# Patient Record
Sex: Male | Born: 1960
Health system: Southern US, Community
[De-identification: ages and names within clinical notes are randomized; demographics above are authoritative.]

## PROBLEM LIST (undated history)

## (undated) DIAGNOSIS — T7840XA Allergy, unspecified, initial encounter: Secondary | ICD-10-CM

## (undated) DIAGNOSIS — I10 Essential (primary) hypertension: Secondary | ICD-10-CM

## (undated) HISTORY — DX: Essential (primary) hypertension: I10

## (undated) HISTORY — DX: Allergy, unspecified, initial encounter: T78.40XA

## (undated) HISTORY — PX: VASECTOMY: SHX75

---
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
## 2001-04-22 ENCOUNTER — Encounter: Payer: Self-pay | Admitting: Emergency Medicine

## 2001-04-22 ENCOUNTER — Emergency Department (HOSPITAL_COMMUNITY): Admission: EM | Admit: 2001-04-22 | Discharge: 2001-04-22 | Payer: Self-pay | Admitting: Emergency Medicine

## 2006-01-21 ENCOUNTER — Ambulatory Visit: Payer: Self-pay | Admitting: Family Medicine

## 2006-03-10 ENCOUNTER — Ambulatory Visit: Payer: Self-pay | Admitting: Family Medicine

## 2006-07-23 ENCOUNTER — Ambulatory Visit: Payer: Self-pay | Admitting: Family Medicine

## 2007-08-25 ENCOUNTER — Encounter: Admission: RE | Admit: 2007-08-25 | Discharge: 2007-08-25 | Payer: Self-pay | Admitting: Occupational Medicine

## 2011-02-09 ENCOUNTER — Other Ambulatory Visit: Payer: Self-pay | Admitting: Specialist

## 2011-02-09 ENCOUNTER — Ambulatory Visit (HOSPITAL_COMMUNITY)
Admission: RE | Admit: 2011-02-09 | Discharge: 2011-02-09 | Disposition: A | Discharge status: Home / Self Care | Payer: BC Managed Care – PPO | Source: Ambulatory Visit | Attending: Specialist | Admitting: Specialist

## 2011-02-09 ENCOUNTER — Other Ambulatory Visit (HOSPITAL_COMMUNITY): Payer: Self-pay | Admitting: Specialist

## 2011-02-09 ENCOUNTER — Encounter (HOSPITAL_COMMUNITY): Payer: BC Managed Care – PPO

## 2011-02-09 DIAGNOSIS — Z01818 Encounter for other preprocedural examination: Secondary | ICD-10-CM

## 2011-02-09 DIAGNOSIS — R9431 Abnormal electrocardiogram [ECG] [EKG]: Secondary | ICD-10-CM | POA: Insufficient documentation

## 2011-02-09 DIAGNOSIS — S43429A Sprain of unspecified rotator cuff capsule, initial encounter: Secondary | ICD-10-CM | POA: Insufficient documentation

## 2011-02-09 DIAGNOSIS — X58XXXA Exposure to other specified factors, initial encounter: Secondary | ICD-10-CM | POA: Insufficient documentation

## 2011-02-09 DIAGNOSIS — Z01810 Encounter for preprocedural cardiovascular examination: Secondary | ICD-10-CM | POA: Insufficient documentation

## 2011-02-09 DIAGNOSIS — Z01812 Encounter for preprocedural laboratory examination: Secondary | ICD-10-CM | POA: Insufficient documentation

## 2011-02-09 LAB — CBC
HCT: 42.3 % (ref 39.0–52.0)
Hemoglobin: 14.7 g/dL (ref 13.0–17.0)
MCH: 29.6 pg (ref 26.0–34.0)
MCHC: 34.8 g/dL (ref 30.0–36.0)
MCV: 85.1 fL (ref 78.0–100.0)
Platelets: 185 10*3/uL (ref 150–400)
RBC: 4.97 MIL/uL (ref 4.22–5.81)
RDW: 12.6 % (ref 11.5–15.5)
WBC: 5.7 10*3/uL (ref 4.0–10.5)

## 2011-02-09 LAB — BASIC METABOLIC PANEL
BUN: 15 mg/dL (ref 6–23)
CO2: 26 mEq/L (ref 19–32)
Calcium: 9.1 mg/dL (ref 8.4–10.5)
Chloride: 106 mEq/L (ref 96–112)
Creatinine, Ser: 0.93 mg/dL (ref 0.4–1.5)
GFR calc Af Amer: >60 mL/min (ref >60)
GFR calc non Af Amer: >60 mL/min (ref >60)
Glucose, Bld: 97 mg/dL (ref 70–99)
Potassium: 3.9 mEq/L (ref 3.5–5.1)
Sodium: 137 mEq/L (ref 135–145)

## 2011-02-09 LAB — SURGICAL PCR SCREEN
MRSA, PCR: NEGATIVE
Staphylococcus aureus: NEGATIVE

## 2011-02-15 HISTORY — PX: SHOULDER ARTHROSCOPY W/ ROTATOR CUFF REPAIR: SHX2400

## 2011-02-19 ENCOUNTER — Ambulatory Visit (HOSPITAL_COMMUNITY)
Admission: RE | Admit: 2011-02-19 | Discharge: 2011-02-20 | Disposition: A | Discharge status: Home / Self Care | Payer: BC Managed Care – PPO | Source: Ambulatory Visit | Attending: Specialist | Admitting: Specialist

## 2011-02-19 DIAGNOSIS — Z01811 Encounter for preprocedural respiratory examination: Secondary | ICD-10-CM | POA: Insufficient documentation

## 2011-02-19 DIAGNOSIS — E669 Obesity, unspecified: Secondary | ICD-10-CM | POA: Insufficient documentation

## 2011-02-19 DIAGNOSIS — M25819 Other specified joint disorders, unspecified shoulder: Secondary | ICD-10-CM | POA: Insufficient documentation

## 2011-02-19 DIAGNOSIS — M719 Bursopathy, unspecified: Secondary | ICD-10-CM | POA: Insufficient documentation

## 2011-02-19 DIAGNOSIS — M19019 Primary osteoarthritis, unspecified shoulder: Secondary | ICD-10-CM | POA: Insufficient documentation

## 2011-02-19 DIAGNOSIS — M67919 Unspecified disorder of synovium and tendon, unspecified shoulder: Secondary | ICD-10-CM | POA: Insufficient documentation

## 2011-02-26 NOTE — Op Note (Signed)
Billy Haynes, Billy Haynes               ACCOUNT NO.:  [id number]  MEDICAL RECORD NO.:  [id number]           PATIENT TYPE:  O  LOCATION:  1613                         FACILITY:  [facility]  PHYSICIAN:  Jene Every, M.D.    DATE OF BIRTH:  1961-10-12  DATE OF PROCEDURE: DATE OF DISCHARGE:                              OPERATIVE REPORT   PREOPERATIVE DIAGNOSES:  Massive tear of the rotator cuff, acromioclavicular arthrosis, impingement syndrome.  POSTOPERATIVE DIAGNOSES:  Massive tear of the rotator cuff, acromioclavicular arthrosis, impingement syndrome.  PROCEDURE PERFORMED: 1. Examination under anesthesia. 2. Mini open repair of a massive tear of the rotator cuff involving     the infraspinatus, supraspinatus and the subscapularis. 3. Debridement of the biceps tendon. 4. Lavage of the glenohumeral joint, subacromial decompression,     acromioplasty.  BRIEF HISTORY:  A 50 year old with retracted tear of the rotator cuff, AC arthrosis, but preoperatively and on the last examination he had no significant pain over the [facility] joint.  He had a retracted tear, however, involving the supraspinatus and the infraspinatus, degeneration of the glenohumeral joint, bicipital tearing.  He was indicated for an open rotator cuff repair, but we discussed the risks and benefits including bleeding, infection, no change in symptoms, worsening symptoms, inability to repair, need for patch graft, partial repair, need for shoulder arthroplasty in the future and possible distal clavicle resection if mobilization was needed, DVT, PE, anesthetic complications, et [name].  TECHNIQUE:  The patient in the supine beach-chair position.  After the induction of adequate general anesthesia, 2 grams Kefzol, the right shoulder and upper extremity was prepped and draped in the usual sterile fashion after examination revealed normal range of motion.  A surgical marker was utilized to delineate the acromion and the [facility] joint  and I made a small incision over the anterolateral aspect of the acromion. Subcutaneous tissue was dissected.  Electrocautery was utilized to achieve hemostasis about 2 cm in length.  The raphe between the anterolateral heads was identified, divided, subperiosteally elevated from the anterolateral and the anteromedial aspect of the acromion with a large anterolateral spur noted on the acromion which was then removed with a 3-mm Kerrison preserving the attachment of the deltoid.  We removed the anterolateral spur as well with a 3-mm Kerrison.  We digitally lysed adhesions.  Retracted tear of the rotator cuff massive was noted also involving the supraspinatus and also involving the subscapularis coming over the bicipital groove.  We lavaged the glenohumeral joint and the biceps.  There was just some generalized fraying.  This was debrided to perhaps 85% of the tendon was intact.  In its attachment we had severe adhesions of the rotator cuff against the subacromial space and beneath the glenoid.  I meticulously mobilized the cuff utilizing the Cobb, not advancing past the glenoid.  We mobilized tissue on top of the tendon, beneath the tendon and against the glenoid with reasonable mobilization of the cuff.  However, the deficiency was noted in the infraspinatus and a good portion of this was absent as I advanced the tendon and prepared a bed, removing approximately 3 mm of the  lateral portion of the articular surface anteriorly and laterally. The subscapularis tendon and the supraspinatus was advanced past the bicipital groove over to the normal footprint of the subscapularis and of the biceps of the supraspinatus.  The infraspinatus portion of it came anteriorly and laterally, however, there was a big deficiency in it posteriorly.  There was no tendon back there, however, we felt that the majority of the supraspinatus and partial of the infraspinatus and subscapularis we were able to cover  the head at least, which was uncovered when we initially opened the joint.  We prepared a bed carefully just medial to the prominence of the greater tuberosity to that portion of the articular surface.  Previously described good bone was noted.  I placed two Mitek suture anchors, one just lateral to the bicipital groove at the articular margin and one posterior to that, approximately a centimeter and a half to capture the supraspinatus, infraspinatus juncture.  I used Ethibond stitches in the rotator cuff to hold the advancement and slight abduction of the arm with the arm in the neutral position.  We then threaded both of the Mitek sutures up through the supraspinatus, the infraspinatus and the subscapularis and advanced the knot to secure that to the anchor.  We used the arthroscopic knot pusher to facilitate the tying of that within this space.  After that we crossed the sutures, laid them down over the leading edge of the tear and over the edge of the greater tuberosity put two push lock suture anchors using the awl and good bone, crossing them and advancing them into the bone with excellent purchase and pullout purchase.  This created full coverage of the bony bed with the rotator cuff leaflet at slight abduction and neutral rotation.  The crossing also matted down that lateral part of the tendon, giving [place] not only a double row of suture fixation but a cross fixation on top of the tendon as well.  I then checked posteriorly as the infraspinatus coursed backwards over the back portion of the humeral head and reexamined meticulously any area where there was any additional infraspinatus that could be advanced and secured posteriorly.  There was not, however, and so there was a deficiency posteriorly for that.  The wound was copiously irrigated. There was no significant tension on the site.  Copiously irrigated the wound with antibiotic irrigation.  Marcaine with epinephrine  was infiltrated in the deltoid and the soft tissues.  Electrocautery was utilized to achieve hemostasis.  I repaired the raphe with #1 Vicryl interrupted figure-of-eight sutures over the top and through the acromion, subcutaneous with 2-0 Vicryl simple sutures.  The skin was reapproximated with 4-0 subcuticular Prolene.  The wound was reinforced was Steri-Strips, placed in an abduction pillow, extubated without difficulty and transported to the recovery room in satisfactory condition.  The patient tolerated the procedure well and there were no complications.  ASSISTANT:  Roma Schanz, P.A.     Jene Every, M.D.     Cordelia Pen  D:  02/19/2011  T:  02/19/2011  Job:  [id number]  Electronically Signed by Jene Every M.D. on 02/26/2011 12:54:18 PM

## 2016-03-11 DIAGNOSIS — L821 Other seborrheic keratosis: Secondary | ICD-10-CM | POA: Diagnosis not present

## 2016-03-11 DIAGNOSIS — D225 Melanocytic nevi of trunk: Secondary | ICD-10-CM | POA: Diagnosis not present

## 2016-03-11 DIAGNOSIS — L570 Actinic keratosis: Secondary | ICD-10-CM | POA: Diagnosis not present

## 2016-03-11 DIAGNOSIS — Z86018 Personal history of other benign neoplasm: Secondary | ICD-10-CM | POA: Diagnosis not present

## 2016-06-08 DIAGNOSIS — L570 Actinic keratosis: Secondary | ICD-10-CM | POA: Diagnosis not present

## 2016-06-08 DIAGNOSIS — L820 Inflamed seborrheic keratosis: Secondary | ICD-10-CM | POA: Diagnosis not present

## 2016-06-08 DIAGNOSIS — D485 Neoplasm of uncertain behavior of skin: Secondary | ICD-10-CM | POA: Diagnosis not present

## 2017-01-15 ENCOUNTER — Ambulatory Visit (INDEPENDENT_AMBULATORY_CARE_PROVIDER_SITE_OTHER): Payer: BLUE CROSS/BLUE SHIELD | Admitting: Physician Assistant

## 2017-01-15 VITALS — BP 122/76 | HR 71 | Temp 98.1°F | Ht 71.0 in | Wt 232.2 lb

## 2017-01-15 DIAGNOSIS — B349 Viral infection, unspecified: Secondary | ICD-10-CM | POA: Diagnosis not present

## 2017-01-15 DIAGNOSIS — R05 Cough: Secondary | ICD-10-CM | POA: Diagnosis not present

## 2017-01-15 DIAGNOSIS — J3489 Other specified disorders of nose and nasal sinuses: Secondary | ICD-10-CM

## 2017-01-15 DIAGNOSIS — R059 Cough, unspecified: Secondary | ICD-10-CM

## 2017-01-15 MED ORDER — FLUTICASONE PROPIONATE 50 MCG/ACT NA SUSP: 2.0000 | Freq: Every day | NASAL | 6 refills | Status: DC

## 2017-01-15 MED ORDER — BENZONATATE 100 MG PO CAPS: 100.0000 mg | ORAL_CAPSULE | Freq: Three times a day (TID) | ORAL | 0 refills | Status: DC | PRN

## 2017-01-15 MED ORDER — MUCINEX DM MAXIMUM STRENGTH 60-1200 MG PO TB12: 1.0000 | ORAL_TABLET | Freq: Two times a day (BID) | ORAL | 1 refills | Status: DC

## 2017-01-15 MED ORDER — HYDROCODONE-HOMATROPINE 5-1.5 MG/5ML PO SYRP: 5.0000 mL | ORAL_SOLUTION | Freq: Three times a day (TID) | ORAL | 0 refills | Status: DC | PRN

## 2017-01-15 NOTE — Addendum Note (Signed)
Addended by: Dorise Hiss on: 01/15/2017 09:56 AM   Modules accepted: Level of Service

## 2017-01-15 NOTE — Progress Notes (Signed)
   Billy Haynes  MRN: [id number] DOB: 05/22/1961  PCP: No primary care provider on file.  Subjective:  Pt is a 56 year old male who presents to clinic for cough x 2 days. Endorses chest congestion, nasal congestion. He is sleeping ok. Appetite okay.  Denies muscle aches, fatigue, fever, chills, SOB, wheezing.  He has taken Alkaseltzer cold plus, mucinex, ibuprofen.  Did not get flu shot this year.   Review of Systems  Constitutional: Negative for chills, diaphoresis and fever.  HENT: Positive for congestion, postnasal drip and rhinorrhea. Negative for sore throat.   Respiratory: Positive for cough. Negative for chest tightness, shortness of breath and wheezing.   Cardiovascular: Negative for chest pain and palpitations.  Gastrointestinal: Negative for abdominal pain, diarrhea, nausea and vomiting.  Psychiatric/Behavioral: Negative for sleep disturbance.    There are no active problems to display for this patient.   No current outpatient prescriptions on file prior to visit.   No current facility-administered medications on file prior to visit.     No Known Allergies   Objective:  BP 122/76 (BP Location: Right Arm, Patient Position: Sitting, Cuff Size: Large)   Pulse 71   Temp 98.1 F (36.7 C) (Oral)   Ht 5\' 11"  (1.803 m)   Wt 232 lb 3.2 oz (105.3 kg)   SpO2 97%   BMI 32.39 kg/m   Physical Exam  Constitutional: He is oriented to person, place, and time and well-developed, well-nourished, and in no distress. No distress.  HENT:  Right Ear: Tympanic membrane normal.  Left Ear: Tympanic membrane normal.  Nose: Mucosal edema and rhinorrhea present. Right sinus exhibits no maxillary sinus tenderness and no frontal sinus tenderness. Left sinus exhibits no maxillary sinus tenderness and no frontal sinus tenderness.  Mouth/Throat: Mucous membranes are normal. Posterior oropharyngeal edema present. No oropharyngeal exudate or posterior oropharyngeal erythema.    Cardiovascular: Normal rate, regular rhythm and normal heart sounds.   Pulmonary/Chest: Effort normal. No respiratory distress. He has no wheezes. He has no rales.  Neurological: He is alert and oriented to person, place, and time. GCS score is 15.  Skin: Skin is warm and dry.  Psychiatric: Mood, memory, affect and judgment normal.  Vitals reviewed.   Assessment and Plan :  1. Viral illness 2. Cough 3. Nasal drainage - HYDROcodone-homatropine (HYCODAN) 5-1.5 MG/5ML syrup; Take 5 mLs by mouth every 8 (eight) hours as needed for cough.  Dispense: 120 mL; Refill: 0 - benzonatate (TESSALON) 100 MG capsule; Take 1-2 capsules (100-200 mg total) by mouth 3 (three) times daily as needed for cough.  Dispense: 40 capsule; Refill: 0 - Dextromethorphan-Guaifenesin (MUCINEX DM MAXIMUM STRENGTH) 60-1200 MG TB12; Take 1 tablet by mouth every 12 (twelve) hours.  Dispense: 14 each; Refill: 1 - fluticasone (FLONASE) 50 MCG/ACT nasal spray; Place 2 sprays into both nostrils daily.  Dispense: 16 g; Refill: 6 - Suspect viral illness. Supportive care: Push fluids, rest. RTC in 5-7 days if no improvement.   Mercer Pod, PA-C  [facility] 01/15/2017 8:39 AM

## 2017-01-15 NOTE — Patient Instructions (Addendum)
Please stay well hydrated - drink 2-3 liters of water a day. Warm tea with honey and lemon is helpful for sore throat and cough.  Hycodan is a controlled substance. Please take this as directed.  Flonse - 2 sprays each nostril before bed. You may repeat this in the morning if needed.  Tessalon is for daytime cough.  Thank you for coming in today. I hope you feel we met your needs.  Feel free to call [facility] if you have any questions or further requests.  Please consider signing up for MyChart if you do not already have it, as this is a great way to communicate with me.  Best,  Whitney McVey, PA-C  IF you received an x-ray today, you will receive an invoice from [facility]. Please contact [facility] at [phone] with questions or concerns regarding your invoice.   IF you received labwork today, you will receive an invoice from [facility]. Please contact [facility] at [phone] with questions or concerns regarding your invoice.   Our billing staff will not be able to assist you with questions regarding bills from these companies.  You will be contacted with the lab results as soon as they are available. The fastest way to get your results is to activate your My Chart account. Instructions are located on the last page of this paperwork. If you have not heard from [place] regarding the results in 2 weeks, please contact this office.

## 2017-02-15 ENCOUNTER — Ambulatory Visit (INDEPENDENT_AMBULATORY_CARE_PROVIDER_SITE_OTHER): Payer: BLUE CROSS/BLUE SHIELD

## 2017-02-15 ENCOUNTER — Ambulatory Visit (INDEPENDENT_AMBULATORY_CARE_PROVIDER_SITE_OTHER): Payer: BLUE CROSS/BLUE SHIELD | Admitting: Emergency Medicine

## 2017-02-15 VITALS — BP 130/90 | HR 85 | Temp 99.0°F | Resp 18 | Ht 71.0 in | Wt 232.0 lb

## 2017-02-15 DIAGNOSIS — R05 Cough: Secondary | ICD-10-CM

## 2017-02-15 DIAGNOSIS — J209 Acute bronchitis, unspecified: Secondary | ICD-10-CM | POA: Diagnosis not present

## 2017-02-15 DIAGNOSIS — R059 Cough, unspecified: Secondary | ICD-10-CM

## 2017-02-15 MED ORDER — HYDROCODONE-HOMATROPINE 5-1.5 MG/5ML PO SYRP: 5.0000 mL | ORAL_SOLUTION | Freq: Three times a day (TID) | ORAL | 0 refills | Status: DC | PRN

## 2017-02-15 MED ORDER — AZITHROMYCIN 250 MG PO TABS
ORAL_TABLET | ORAL | 0 refills | Status: DC
Start: 2017-02-15 — End: 2018-11-02

## 2017-02-15 NOTE — Patient Instructions (Addendum)
     IF you received an x-ray today, you will receive an invoice from [facility]. Please contact [facility] at [phone] with questions or concerns regarding your invoice.   IF you received labwork today, you will receive an invoice from [facility]. Please contact [facility] at [phone] with questions or concerns regarding your invoice.   Our billing staff will not be able to assist you with questions regarding bills from these companies.  You will be contacted with the lab results as soon as they are available. The fastest way to get your results is to activate your My Chart account. Instructions are located on the last page of this paperwork. If you have not heard from us regarding the results in 2 weeks, please contact this office.      Acute Bronchitis, Adult Acute bronchitis is when air tubes (bronchi) in the lungs suddenly get swollen. The condition can make it hard to breathe. It can also cause these symptoms:  A cough.  Coughing up clear, yellow, or green mucus.  Wheezing.  Chest congestion.  Shortness of breath.  A fever.  Body aches.  Chills.  A sore throat.  Follow these instructions at home: Medicines  Take over-the-counter and prescription medicines only as told by your doctor.  If you were prescribed an antibiotic medicine, take it as told by your doctor. Do not stop taking the antibiotic even if you start to feel better. General instructions  Rest.  Drink enough fluids to keep your pee (urine) clear or pale yellow.  Avoid smoking and secondhand smoke. If you smoke and you need help quitting, ask your doctor. Quitting will help your lungs heal faster.  Use an inhaler, cool mist vaporizer, or humidifier as told by your doctor.  Keep all follow-up visits as told by your doctor. This is important. How is this prevented? To lower your risk of getting this condition again:  Wash your hands often with soap and water. If you cannot  use soap and water, use hand sanitizer.  Avoid contact with people who have cold symptoms.  Try not to touch your hands to your mouth, nose, or eyes.  Make sure to get the flu shot every year.  Contact a doctor if:  Your symptoms do not get better in 2 weeks. Get help right away if:  You cough up blood.  You have chest pain.  You have very bad shortness of breath.  You become dehydrated.  You faint (pass out) or keep feeling like you are going to pass out.  You keep throwing up (vomiting).  You have a very bad headache.  Your fever or chills gets worse. This information is not intended to replace advice given to you by your health care provider. Make sure you discuss any questions you have with your health care provider. Document Released: 04/27/2008 Document Revised: 06/17/2016 Document Reviewed: 04/29/2016 Elsevier Interactive Patient Education  2017 Elsevier Inc.  

## 2017-02-15 NOTE — Progress Notes (Signed)
Billy Haynes 56 y.o.   Chief Complaint  Patient presents with  . Fever  . Diarrhea    HISTORY OF PRESENT ILLNESS: This is a 56 y.o. male complaining of cough, fever, flu-like symptoms x 4-5 days; yesterday had diarrhea x2.  Cough  This is a new problem. The current episode started in the past 7 days. The problem has been gradually worsening. The cough is productive of sputum. Associated symptoms include nasal congestion. Pertinent negatives include no chest pain, chills, fever, headaches, heartburn, hemoptysis, myalgias, rash, rhinorrhea, sore throat or shortness of breath. There is no history of asthma, COPD or emphysema.     Prior to Admission medications   Medication Sig Start Date End Date Taking? Authorizing Provider  Dextromethorphan-Guaifenesin (MUCINEX DM MAXIMUM STRENGTH) 60-1200 MG TB12 Take 1 tablet by mouth every 12 (twelve) hours. 01/15/17  Yes Elizabeth Whitney McVey, PA-C  benzonatate (TESSALON) 100 MG capsule Take 1-2 capsules (100-200 mg total) by mouth 3 (three) times daily as needed for cough. Patient not taking: Reported on 02/15/2017 01/15/17   Gelene Mink McVey, PA-C  fluticasone [facility]) 50 MCG/ACT nasal spray Place 2 sprays into both nostrils daily. Patient not taking: Reported on 02/15/2017 01/15/17   [facility] McVey, PA-C  HYDROcodone-homatropine [facility]) 5-1.5 MG/5ML syrup Take 5 mLs by mouth every 8 (eight) hours as needed for cough. Patient not taking: Reported on 02/15/2017 01/15/17   Gelene Mink McVey, PA-C    No Known Allergies  There are no active problems to display for this patient.   History reviewed. No pertinent past medical history.  Past Surgical History:  Procedure Laterality Date  . SHOULDER ARTHROSCOPY W/ ROTATOR CUFF REPAIR Right 02/15/2011  . VASECTOMY      Social History   Social History  . Marital status: Married    Spouse name: N/A  . Number of children: N/A  . Years of education: N/A   Occupational  History  . Not on file.   Social History Main Topics  . Smoking status: Never Smoker  . Smokeless tobacco: Never Used  . Alcohol use 1.2 oz/week    2 Cans of beer per week  . Drug use: No  . Sexual activity: Not on file   Other Topics Concern  . Not on file   Social History Narrative  . No narrative on file    Family History  Problem Relation Age of Onset  . Hyperlipidemia Mother   . Heart disease Father   . Hyperlipidemia Father      Review of Systems  Constitutional: Negative for chills and fever.  HENT: Negative for nosebleeds, rhinorrhea, sinus pain and sore throat.   Eyes: Negative for blurred vision and double vision.  Respiratory: Positive for cough. Negative for hemoptysis and shortness of breath.   Cardiovascular: Negative for chest pain and palpitations.  Gastrointestinal: Negative for abdominal pain, diarrhea, heartburn, nausea and vomiting.  Genitourinary: Negative for dysuria and hematuria.  Musculoskeletal: Negative for myalgias.  Skin: Negative for rash.  Neurological: Negative for dizziness and headaches.  All other systems reviewed and are negative.  Vitals:   02/15/17 1551 02/15/17 1604  BP: (!) 146/91 130/90  Pulse: 85   Resp: 18   Temp: 99 F (37.2 C)     Physical Exam  Constitutional: He is oriented to person, place, and time. He appears well-developed and well-nourished.  HENT:  Head: Normocephalic and atraumatic.  Nose: Nose normal.  Mouth/Throat: Oropharynx is clear and moist. No oropharyngeal exudate.  Eyes:  Conjunctivae and EOM are normal. Pupils are equal, round, and reactive to light.  Neck: Normal range of motion. Neck supple. No JVD present. No thyromegaly present.  Cardiovascular: Normal rate, regular rhythm and normal heart sounds.   Pulmonary/Chest: Effort normal and breath sounds normal.  Abdominal: Soft. Bowel sounds are normal. He exhibits no distension. There is no tenderness.  Musculoskeletal: Normal range of motion.    Lymphadenopathy:    He has no cervical adenopathy.  Neurological: He is alert and oriented to person, place, and time. No sensory deficit. He exhibits normal muscle tone.  Skin: Skin is warm and dry. Capillary refill takes less than 2 seconds.  Psychiatric: He has a normal mood and affect. His behavior is normal.  Vitals reviewed.  CXR: NAD  ASSESSMENT & PLAN: Kaamil was seen today for fever and diarrhea.  Diagnoses and all orders for this visit:  Acute bronchitis, unspecified organism  Cough -     DG Chest 2 View; Future  Other orders -     azithromycin (ZITHROMAX) 250 MG tablet; Sig as indicated -     HYDROcodone-homatropine (HYCODAN) 5-1.5 MG/5ML syrup; Take 5 mLs by mouth every 8 (eight) hours as needed for cough.    Patient Instructions       IF you received an x-ray today, you will receive an invoice from [facility]. Please contact [facility] at [phone] with questions or concerns regarding your invoice.   IF you received labwork today, you will receive an invoice from [facility]. Please contact [facility] at [phone] with questions or concerns regarding your invoice.   Our billing staff will not be able to assist you with questions regarding bills from these companies.  You will be contacted with the lab results as soon as they are available. The fastest way to get your results is to activate your My Chart account. Instructions are located on the last page of this paperwork. If you have not heard from [place] regarding the results in 2 weeks, please contact this office.      Acute Bronchitis, Adult Acute bronchitis is when air tubes (bronchi) in the lungs suddenly get swollen. The condition can make it hard to breathe. It can also cause these symptoms:  A cough.  Coughing up clear, yellow, or green mucus.  Wheezing.  Chest congestion.  Shortness of breath.  A fever.  Body aches.  Chills.  A sore throat. Follow these  instructions at home: Medicines   Take over-the-counter and prescription medicines only as told by your doctor.  If you were prescribed an antibiotic medicine, take it as told by your doctor. Do not stop taking the antibiotic even if you start to feel better. General instructions   Rest.  Drink enough fluids to keep your pee (urine) clear or pale yellow.  Avoid smoking and secondhand smoke. If you smoke and you need help quitting, ask your doctor. Quitting will help your lungs heal faster.  Use an inhaler, cool mist vaporizer, or humidifier as told by your doctor.  Keep all follow-up visits as told by your doctor. This is important. How is this prevented? To lower your risk of getting this condition again:  Wash your hands often with soap and water. If you cannot use soap and water, use hand sanitizer.  Avoid contact with people who have cold symptoms.  Try not to touch your hands to your mouth, nose, or eyes.  Make sure to get the flu shot every year. Contact a doctor if:  Your symptoms do not get better in 2 weeks. Get help right away if:  You cough up blood.  You have chest pain.  You have very bad shortness of breath.  You become dehydrated.  You faint (pass out) or keep feeling like you are going to pass out.  You keep throwing up (vomiting).  You have a very bad headache.  Your fever or chills gets worse. This information is not intended to replace advice given to you by your health care provider. Make sure you discuss any questions you have with your health care provider. Document Released: 04/27/2008 Document Revised: 06/17/2016 Document Reviewed: 04/29/2016 Elsevier Interactive Patient Education  2017 Elsevier Inc.      Agustina Caroli, MD [facility]

## 2017-04-12 DIAGNOSIS — Z86018 Personal history of other benign neoplasm: Secondary | ICD-10-CM | POA: Diagnosis not present

## 2017-04-12 DIAGNOSIS — D1801 Hemangioma of skin and subcutaneous tissue: Secondary | ICD-10-CM | POA: Diagnosis not present

## 2017-04-12 DIAGNOSIS — L814 Other melanin hyperpigmentation: Secondary | ICD-10-CM | POA: Diagnosis not present

## 2017-04-12 DIAGNOSIS — L821 Other seborrheic keratosis: Secondary | ICD-10-CM | POA: Diagnosis not present

## 2017-04-12 DIAGNOSIS — L820 Inflamed seborrheic keratosis: Secondary | ICD-10-CM | POA: Diagnosis not present

## 2017-09-02 DIAGNOSIS — L820 Inflamed seborrheic keratosis: Secondary | ICD-10-CM | POA: Diagnosis not present

## 2017-09-02 DIAGNOSIS — L821 Other seborrheic keratosis: Secondary | ICD-10-CM | POA: Diagnosis not present

## 2017-09-02 DIAGNOSIS — L812 Freckles: Secondary | ICD-10-CM | POA: Diagnosis not present

## 2017-09-02 DIAGNOSIS — D225 Melanocytic nevi of trunk: Secondary | ICD-10-CM | POA: Diagnosis not present

## 2018-04-04 DIAGNOSIS — D2272 Melanocytic nevi of left lower limb, including hip: Secondary | ICD-10-CM | POA: Diagnosis not present

## 2018-04-04 DIAGNOSIS — D225 Melanocytic nevi of trunk: Secondary | ICD-10-CM | POA: Diagnosis not present

## 2018-04-04 DIAGNOSIS — L821 Other seborrheic keratosis: Secondary | ICD-10-CM | POA: Diagnosis not present

## 2018-04-04 DIAGNOSIS — I788 Other diseases of capillaries: Secondary | ICD-10-CM | POA: Diagnosis not present

## 2018-11-02 ENCOUNTER — Ambulatory Visit (INDEPENDENT_AMBULATORY_CARE_PROVIDER_SITE_OTHER): Payer: BLUE CROSS/BLUE SHIELD | Admitting: Family Medicine

## 2018-11-02 VITALS — BP 146/102 | HR 68 | Temp 97.8°F | Ht 70.25 in | Wt 233.8 lb

## 2018-11-02 DIAGNOSIS — Z23 Encounter for immunization: Secondary | ICD-10-CM | POA: Diagnosis not present

## 2018-11-02 DIAGNOSIS — Z0000 Encounter for general adult medical examination without abnormal findings: Secondary | ICD-10-CM

## 2018-11-02 DIAGNOSIS — R030 Elevated blood-pressure reading, without diagnosis of hypertension: Secondary | ICD-10-CM

## 2018-11-02 DIAGNOSIS — Z1211 Encounter for screening for malignant neoplasm of colon: Secondary | ICD-10-CM | POA: Diagnosis not present

## 2018-11-02 DIAGNOSIS — Z125 Encounter for screening for malignant neoplasm of prostate: Secondary | ICD-10-CM | POA: Diagnosis not present

## 2018-11-02 DIAGNOSIS — E669 Obesity, unspecified: Secondary | ICD-10-CM | POA: Diagnosis not present

## 2018-11-02 NOTE — Progress Notes (Signed)
Subjective:    Patient ID: Billy Haynes, male    DOB: 05-02-1961, 57 y.o.   MRN: [id number]  HPI This is a 57 yo male who presents today to establish care and for complete physical exam. Has not had regular care other than urgent care.  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  His job is sedentary.  He is married and has 2 children.  Last CPE- many years PSA-unknown Colonoscopy-never, will put in referral today Tdap-greater than 10 years, will have today Flu-declines Dental-regular Eye-overdue Exercise-intermittently goes to the gym   No past medical history on file. Past Surgical History:  Procedure Laterality Date  . SHOULDER ARTHROSCOPY W/ ROTATOR CUFF REPAIR Right 02/15/2011  . VASECTOMY     Family History  Problem Relation Age of Onset  . Hyperlipidemia Mother   . Heart disease Father   . Hyperlipidemia Father    Social History   Tobacco Use  . Smoking status: Never Smoker  . Smokeless tobacco: Never Used  Substance Use Topics  . Alcohol use: Yes    Alcohol/week: 2.0 standard drinks    Types: 2 Cans of beer per week  . Drug use: No      Review of Systems  Constitutional: Negative.   HENT: Negative.   Eyes: Negative.   Respiratory: Negative.   Cardiovascular: Negative.   Gastrointestinal: Negative.   Endocrine: Negative.   Genitourinary: Negative.   Musculoskeletal: Negative.   Skin: Negative.   Allergic/Immunologic: Negative.   Neurological: Negative.   Hematological: Negative.   Psychiatric/Behavioral: Negative.        Objective:   Physical Exam Physical Exam  Constitutional: He is oriented to person, place, and time. He appears well-developed and well-nourished.  HENT:  Head: Normocephalic and atraumatic.  Right Ear: External ear normal.  Left Ear: External ear normal.  Nose: Nose normal.  Mouth/Throat: Oropharynx is clear and moist.  Eyes: Conjunctivae are normal. Pupils are equal, round, and reactive to light.  Neck:  Normal range of motion. Neck supple.  Cardiovascular: Normal rate, regular rhythm, normal heart sounds and intact distal pulses.   Pulmonary/Chest: Effort normal and breath sounds normal.  Abdominal: Soft. Bowel sounds are normal. Hernia confirmed negative in the right inguinal area and confirmed negative in the left inguinal area.  Genitourinary: Testes normal and penis normal. Circumcised.  Musculoskeletal: Normal range of motion. He exhibits no edema or tenderness.       Cervical back: Normal.       Thoracic back: Normal.       Lumbar back: Normal.  Lymphadenopathy:    He has no cervical adenopathy.       Right: No inguinal adenopathy present.       Left: No inguinal adenopathy present.  Neurological: He is alert and oriented to person, place, and time. He has normal reflexes.  Skin: Skin is warm and dry.  Psychiatric: He has a normal mood and affect. His behavior is normal. Judgment normal.  Vitals reviewed.   BP (!) 140/98 (BP Location: Right Arm, Patient Position: Sitting, Cuff Size: Normal)   Pulse 68   Temp 97.8 F (36.6 C) (Oral)   Ht 5' 10.25" (1.784 m)   Wt 233 lb 12.8 oz (106.1 kg)   SpO2 97%   BMI 33.31 kg/m  Wt Readings from Last 3 Encounters:  11/02/18 233 lb 12.8 oz (106.1 kg)  02/15/17 232 lb (105.2 kg)  01/15/17 232 lb 3.2 oz (105.3 kg)   BP  Readings from Last 3 Encounters:  11/02/18 (!) 146/102  02/15/17 130/90  01/15/17 122/76   Depression screen PHQ 2/9 11/02/2018 02/15/2017 01/15/2017  Decreased Interest 0 0 0  Down, Depressed, Hopeless 0 0 0  PHQ - 2 Score 0 0 0       Assessment & Plan:  1. Annual physical exam - Discussed and encouraged healthy lifestyle choices- adequate sleep, regular exercise, stress management and healthy food choices.    2. Obesity (BMI 30.0-34.9) -Encouraged healthy food choices and regular exercise - CBC with Differential - Comprehensive metabolic panel - Lipid Panel - Hemoglobin A1c  3. Screening for prostate  cancer - PSA  4. Screening for colon cancer -He is agreeable for screening, referral placed - Ambulatory referral to Gastroenterology  5. Need for Tdap vaccination - Tdap vaccine greater than or equal to 7yo IM  6. Elevated blood pressure reading -Discussed with patient, encouraged weight loss, regular exercise, decrease sodium intake  Clarene Reamer, FNP-BC  [facility]  11/05/2018 10:11 AM

## 2018-11-02 NOTE — Patient Instructions (Signed)
Good to see you today  I will notify you of lab results  You will get a call about your colonoscopy  Follow up in 1 year  Keeping you healthy  Get these tests  Blood pressure- Have your blood pressure checked once a year by your healthcare provider.  Normal blood pressure is 120/80  Weight- Have your body mass index (BMI) calculated to screen for obesity.  BMI is a measure of body fat based on height and weight. You can also calculate your own BMI at ViewBanking.si.  Cholesterol- Have your cholesterol checked every year.  Diabetes- Have your blood sugar checked regularly if you have high blood pressure, high cholesterol, have a family history of diabetes or if you are overweight.  Screening for Colon Cancer- Colonoscopy starting at age 56.  Screening may begin sooner depending on your family history and other health conditions. Follow up colonoscopy as directed by your Gastroenterologist.  Screening for Prostate Cancer- Both blood work (PSA) and a rectal exam help screen for Prostate Cancer.  Screening begins at age 76 with African-American men and at age 68 with Caucasian men.  Screening may begin sooner depending on your family history.  Take these medicines  Aspirin- One aspirin daily can help prevent Heart disease and Stroke.  Flu shot- Every fall.  Tetanus- Every 10 years.  Zostavax- Once after the age of 60 to prevent Shingles.  Pneumonia shot- Once after the age of 78; if you are younger than 27, ask your healthcare provider if you need a Pneumonia shot.  Take these steps  Don't smoke- If you do smoke, talk to your doctor about quitting.  For tips on how to quit, go to www.smokefree.gov or call 1-800-QUIT-NOW.  Be physically active- Exercise 5 days a week for at least 30 minutes.  If you are not already physically active start slow and gradually work up to 30 minutes of moderate physical activity.  Examples of moderate activity include walking briskly, mowing  the yard, dancing, swimming, bicycling, etc.  Eat a healthy diet- Eat a variety of healthy food such as fruits, vegetables, low fat milk, low fat cheese, yogurt, lean meant, poultry, fish, beans, tofu, etc. For more information go to www.thenutritionsource.org  Drink alcohol in moderation- Limit alcohol intake to less than two drinks a day. Never drink and drive.  Dentist- Brush and floss twice daily; visit your dentist twice a year.  Depression- Your emotional health is as important as your physical health. If you're feeling down, or losing interest in things you would normally enjoy please talk to your healthcare provider.  Eye exam- Visit your eye doctor every year.  Safe sex- If you may be exposed to a sexually transmitted infection, use a condom.  Seat belts- Seat belts can save your life; always wear one.  Smoke/Carbon Monoxide detectors- These detectors need to be installed on the appropriate level of your home.  Replace batteries at least once a year.  Skin cancer- When out in the sun, cover up and use sunscreen 15 SPF or higher.  Violence- If anyone is threatening you, please tell your healthcare provider.  Living Will/ Health care power of attorney- Speak with your healthcare provider and family.

## 2018-11-03 LAB — CBC WITH DIFFERENTIAL/PLATELET
Basophils Absolute: 0 10*3/uL (ref 0.0–0.1)
Basophils Relative: 0.7 % (ref 0.0–3.0)
Eosinophils Absolute: 0.4 10*3/uL (ref 0.0–0.7)
Eosinophils Relative: 6.4 % — ABNORMAL HIGH (ref 0.0–5.0)
HCT: 46.4 % (ref 39.0–52.0)
Hemoglobin: 15.9 g/dL (ref 13.0–17.0)
Lymphocytes Relative: 29.5 % (ref 12.0–46.0)
Lymphs Abs: 1.9 10*3/uL (ref 0.7–4.0)
MCHC: 34.2 g/dL (ref 30.0–36.0)
MCV: 85.2 fl (ref 78.0–100.0)
Monocytes Absolute: 0.6 10*3/uL (ref 0.1–1.0)
Monocytes Relative: 9 % (ref 3.0–12.0)
Neutro Abs: 3.5 10*3/uL (ref 1.4–7.7)
Neutrophils Relative %: 54.4 % (ref 43.0–77.0)
Platelets: 238 10*3/uL (ref 150.0–400.0)
RBC: 5.45 Mil/uL (ref 4.22–5.81)
RDW: 14.2 % (ref 11.5–15.5)
WBC: 6.4 10*3/uL (ref 4.0–10.5)

## 2018-11-03 LAB — COMPREHENSIVE METABOLIC PANEL
ALT: 23 U/L (ref 0–53)
AST: 21 U/L (ref 0–37)
Albumin: 4.5 g/dL (ref 3.5–5.2)
Alkaline Phosphatase: 79 U/L (ref 39–117)
BUN: 16 mg/dL (ref 6–23)
CO2: 30 mEq/L (ref 19–32)
Calcium: 9.5 mg/dL (ref 8.4–10.5)
Chloride: 104 mEq/L (ref 96–112)
Creatinine, Ser: 1.2 mg/dL (ref 0.40–1.50)
GFR: 66.57 mL/min (ref >60.00)
Glucose, Bld: 100 mg/dL — ABNORMAL HIGH (ref 70–99)
Potassium: 4.2 mEq/L (ref 3.5–5.1)
Sodium: 140 mEq/L (ref 135–145)
Total Bilirubin: 0.6 mg/dL (ref 0.2–1.2)
Total Protein: 7.3 g/dL (ref 6.0–8.3)

## 2018-11-03 LAB — LIPID PANEL
Cholesterol: 298 mg/dL — ABNORMAL HIGH (ref 0–200)
HDL: 47.4 mg/dL (ref >39.00)
LDL Cholesterol: 224 mg/dL — ABNORMAL HIGH (ref 0–99)
NonHDL: 250.54
Total CHOL/HDL Ratio: 6
Triglycerides: 134 mg/dL (ref 0.0–149.0)
VLDL: 26.8 mg/dL (ref 0.0–40.0)

## 2018-11-03 LAB — PSA: PSA: 0.9 ng/mL (ref 0.10–4.00)

## 2018-11-03 LAB — HEMOGLOBIN A1C: Hgb A1c MFr Bld: 6.1 % (ref 4.6–6.5)

## 2018-11-04 ENCOUNTER — Encounter: Payer: Self-pay | Admitting: Family Medicine

## 2018-11-05 ENCOUNTER — Encounter: Payer: Self-pay | Admitting: Family Medicine

## 2018-11-18 ENCOUNTER — Encounter: Payer: Self-pay | Admitting: Gastroenterology

## 2018-11-24 DIAGNOSIS — J069 Acute upper respiratory infection, unspecified: Secondary | ICD-10-CM | POA: Diagnosis not present

## 2018-11-25 ENCOUNTER — Ambulatory Visit: Payer: BLUE CROSS/BLUE SHIELD | Admitting: Family Medicine

## 2018-12-29 ENCOUNTER — Encounter: Payer: Self-pay | Admitting: Gastroenterology

## 2018-12-29 ENCOUNTER — Ambulatory Visit (AMBULATORY_SURGERY_CENTER): Payer: Self-pay

## 2018-12-29 VITALS — Ht 70.5 in | Wt 237.0 lb

## 2018-12-29 DIAGNOSIS — Z1211 Encounter for screening for malignant neoplasm of colon: Secondary | ICD-10-CM

## 2018-12-29 MED ORDER — NA SULFATE-K SULFATE-MG SULF 17.5-3.13-1.6 GM/177ML PO SOLN: 1.0000 | Freq: Once | ORAL | 0 refills | Status: AC

## 2018-12-29 NOTE — Progress Notes (Signed)
No egg or soy allergy known to patient  No issues with past sedation with any surgeries  or procedures, no intubation problems  No diet pills per patient No home 02 use per patient  No blood thinners per patient  Pt denies issues with constipation  No A fib or A flutter  EMMI video sent to pt's e mail the patient declined

## 2019-01-12 ENCOUNTER — Encounter: Payer: Self-pay | Admitting: Gastroenterology

## 2019-01-12 ENCOUNTER — Ambulatory Visit (AMBULATORY_SURGERY_CENTER): Payer: BLUE CROSS/BLUE SHIELD | Admitting: Gastroenterology

## 2019-01-12 VITALS — BP 107/73 | HR 56 | Temp 96.9°F | Resp 15 | Ht 70.25 in | Wt 233.0 lb

## 2019-01-12 DIAGNOSIS — Z1211 Encounter for screening for malignant neoplasm of colon: Secondary | ICD-10-CM

## 2019-01-12 DIAGNOSIS — D122 Benign neoplasm of ascending colon: Secondary | ICD-10-CM

## 2019-01-12 MED ORDER — SODIUM CHLORIDE 0.9 % IV SOLN: 500.0000 mL | Freq: Once | INTRAVENOUS | Status: DC

## 2019-01-12 NOTE — Progress Notes (Signed)
Report to PACU, RN, vss, BBS= Clear.  

## 2019-01-12 NOTE — Patient Instructions (Signed)
Handouts given for polyps and diverticulosis  YOU HAD AN ENDOSCOPIC PROCEDURE TODAY AT [facility]:   Refer to the procedure report that was given to you for any specific questions about what was found during the examination.  If the procedure report does not answer your questions, please call your gastroenterologist to clarify.  If you requested that your care partner not be given the details of your procedure findings, then the procedure report has been included in a sealed envelope for you to review at your convenience later.  YOU SHOULD EXPECT: Some feelings of bloating in the abdomen. Passage of more gas than usual.  Walking can help get rid of the air that was put into your GI tract during the procedure and reduce the bloating. If you had a lower endoscopy (such as a colonoscopy or flexible sigmoidoscopy) you may notice spotting of blood in your stool or on the toilet paper. If you underwent a bowel prep for your procedure, you may not have a normal bowel movement for a few days.  Please Note:  You might notice some irritation and congestion in your nose or some drainage.  This is from the oxygen used during your procedure.  There is no need for concern and it should clear up in a day or so.  SYMPTOMS TO REPORT IMMEDIATELY:   Following lower endoscopy (colonoscopy or flexible sigmoidoscopy):  Excessive amounts of blood in the stool  Significant tenderness or worsening of abdominal pains  Swelling of the abdomen that is new, acute  Fever of 100F or higher  For urgent or emergent issues, a gastroenterologist can be reached at any hour by calling [phone].   DIET:  We do recommend a small meal at first, but then you may proceed to your regular diet.  Drink plenty of fluids but you should avoid alcoholic beverages for 24 hours.  ACTIVITY:  You should plan to take it easy for the rest of today and you should NOT DRIVE or use heavy machinery until tomorrow (because of  the sedation medicines used during the test).    FOLLOW UP: Our staff will call the number listed on your records the next business day following your procedure to check on you and address any questions or concerns that you may have regarding the information given to you following your procedure. If we do not reach you, we will leave a message.  However, if you are feeling well and you are not experiencing any problems, there is no need to return our call.  We will assume that you have returned to your regular daily activities without incident.  If any biopsies were taken you will be contacted by phone or by letter within the next 1-3 weeks.  Please call us at [phone] if you have not heard about the biopsies in 3 weeks.    SIGNATURES/CONFIDENTIALITY: You and/or your care partner have signed paperwork which will be entered into your electronic medical record.  These signatures attest to the fact that that the information above on your After Visit Summary has been reviewed and is understood.  Full responsibility of the confidentiality of this discharge information lies with you and/or your care-partner.

## 2019-01-12 NOTE — Op Note (Signed)
[facility] Patient Name: Billy Haynes Procedure Date: 01/12/2019 10:01 AM MRN: [id number] Endoscopist: Remo Lipps P. Havery Moros , MD Age: 58 Referring MD:  Date of Birth: [birth year] Gender: Male Account #: [id number] Procedure:                Colonoscopy Indications:              Screening for colorectal malignant neoplasm, This                            is the patient's first colonoscopy Medicines:                Monitored Anesthesia Care Procedure:                Pre-Anesthesia Assessment:                           - Prior to the procedure, a History and Physical                            was performed, and patient medications and                            allergies were reviewed. The patient's tolerance of                            previous anesthesia was also reviewed. The risks                            and benefits of the procedure and the sedation                            options and risks were discussed with the patient.                            All questions were answered, and informed consent                            was obtained. Prior Anticoagulants: The patient has                            taken no previous anticoagulant or antiplatelet                            agents. ASA Grade Assessment: II - A patient with                            mild systemic disease. After reviewing the risks                            and benefits, the patient was deemed in                            satisfactory condition to undergo the procedure.  After obtaining informed consent, the colonoscope                            was passed under direct vision. Throughout the                            procedure, the patient's blood pressure, pulse, and                            oxygen saturations were monitored continuously. The                            Colonoscope was introduced through the anus and                            advanced to the the  cecum, identified by                            appendiceal orifice and ileocecal valve. The                            colonoscopy was performed without difficulty. The                            patient tolerated the procedure well. The quality                            of the bowel preparation was good. The ileocecal                            valve, appendiceal orifice, and rectum were                            photographed. Scope In: 10:16:31 AM Scope Out: 10:36:52 AM Scope Withdrawal Time: 0 hours 18 minutes 4 seconds  Total Procedure Duration: 0 hours 20 minutes 21 seconds  Findings:                 The perianal and digital rectal examinations were                            normal.                           Two sessile polyps were found in the ascending                            colon. The polyps were 2 to 3 mm in size. These                            polyps were removed with a cold snare. Resection                            and retrieval were complete.  Multiple small-mouthed diverticula were found in                            the sigmoid colon.                           The exam was otherwise without abnormality. Complications:            No immediate complications. Estimated blood loss:                            Minimal. Estimated Blood Loss:     Estimated blood loss was minimal. Impression:               - Two 2 to 3 mm polyps in the ascending colon,                            removed with a cold snare. Resected and retrieved.                           - Diverticulosis in the sigmoid colon.                           - The examination was otherwise normal. Recommendation:           - Patient has a contact number available for                            emergencies. The signs and symptoms of potential                            delayed complications were discussed with the                            patient. Return to normal activities tomorrow.                             Written discharge instructions were provided to the                            patient.                           - Resume previous diet.                           - Continue present medications.                           - Await pathology results. Remo Lipps P. Havery Moros, MD 01/12/2019 10:39:33 AM This report has been signed electronically.

## 2019-01-12 NOTE — Progress Notes (Signed)
Called to room to assist during endoscopic procedure.  Patient ID and intended procedure confirmed with present staff. Received instructions for my participation in the procedure from the performing physician.  

## 2019-01-12 NOTE — Progress Notes (Signed)
Pt's states no medical or surgical changes since previsit or office visit. 

## 2019-01-13 ENCOUNTER — Telehealth: Payer: Self-pay

## 2019-01-13 NOTE — Telephone Encounter (Signed)
  Follow up Call-  Call back number 01/12/2019  Post procedure Call Back phone  # [phone]  Permission to leave phone message Yes  Some recent data might be hidden     Patient questions:  Do you have a fever, pain , or abdominal swelling? No. Pain Score  0 *  Have you tolerated food without any problems? Yes.    Have you been able to return to your normal activities? Yes.    Do you have any questions about your discharge instructions: Diet   No. Medications  No. Follow up visit  No.  Do you have questions or concerns about your Care? No.  Actions: * If pain score is 4 or above: No action needed, pain <4.

## 2019-01-16 ENCOUNTER — Encounter: Payer: Self-pay | Admitting: Gastroenterology

## 2019-04-04 DIAGNOSIS — D225 Melanocytic nevi of trunk: Secondary | ICD-10-CM | POA: Diagnosis not present

## 2019-04-04 DIAGNOSIS — D2271 Melanocytic nevi of right lower limb, including hip: Secondary | ICD-10-CM | POA: Diagnosis not present

## 2019-04-04 DIAGNOSIS — D1801 Hemangioma of skin and subcutaneous tissue: Secondary | ICD-10-CM | POA: Diagnosis not present

## 2019-04-04 DIAGNOSIS — L821 Other seborrheic keratosis: Secondary | ICD-10-CM | POA: Diagnosis not present

## 2019-11-06 ENCOUNTER — Ambulatory Visit (INDEPENDENT_AMBULATORY_CARE_PROVIDER_SITE_OTHER): Payer: BC Managed Care – PPO | Admitting: Family Medicine

## 2019-11-06 ENCOUNTER — Other Ambulatory Visit: Payer: Self-pay

## 2019-11-06 ENCOUNTER — Encounter: Payer: Self-pay | Admitting: Family Medicine

## 2019-11-06 VITALS — BP 122/78 | HR 65 | Temp 97.6°F | Ht 70.0 in | Wt 236.4 lb

## 2019-11-06 DIAGNOSIS — Z125 Encounter for screening for malignant neoplasm of prostate: Secondary | ICD-10-CM

## 2019-11-06 DIAGNOSIS — R7303 Prediabetes: Secondary | ICD-10-CM

## 2019-11-06 DIAGNOSIS — E669 Obesity, unspecified: Secondary | ICD-10-CM

## 2019-11-06 DIAGNOSIS — Z0000 Encounter for general adult medical examination without abnormal findings: Secondary | ICD-10-CM | POA: Diagnosis not present

## 2019-11-06 DIAGNOSIS — E782 Mixed hyperlipidemia: Secondary | ICD-10-CM

## 2019-11-06 LAB — COMPREHENSIVE METABOLIC PANEL
ALT: 19 U/L (ref 0–53)
AST: 18 U/L (ref 0–37)
Albumin: 4.1 g/dL (ref 3.5–5.2)
Alkaline Phosphatase: 82 U/L (ref 39–117)
BUN: 19 mg/dL (ref 6–23)
CO2: 27 mEq/L (ref 19–32)
Calcium: 9.2 mg/dL (ref 8.4–10.5)
Chloride: 106 mEq/L (ref 96–112)
Creatinine, Ser: 1.07 mg/dL (ref 0.40–1.50)
GFR: 71.24 mL/min (ref >60.00)
Glucose, Bld: 103 mg/dL — ABNORMAL HIGH (ref 70–99)
Potassium: 4.3 mEq/L (ref 3.5–5.1)
Sodium: 140 mEq/L (ref 135–145)
Total Bilirubin: 0.5 mg/dL (ref 0.2–1.2)
Total Protein: 6.6 g/dL (ref 6.0–8.3)

## 2019-11-06 LAB — LIPID PANEL
Cholesterol: 254 mg/dL — ABNORMAL HIGH (ref 0–200)
HDL: 42.6 mg/dL (ref >39.00)
LDL Cholesterol: 190 mg/dL — ABNORMAL HIGH (ref 0–99)
NonHDL: 211.75
Total CHOL/HDL Ratio: 6
Triglycerides: 111 mg/dL (ref 0.0–149.0)
VLDL: 22.2 mg/dL (ref 0.0–40.0)

## 2019-11-06 LAB — PSA: PSA: 1.43 ng/mL (ref 0.10–4.00)

## 2019-11-06 LAB — HEMOGLOBIN A1C: Hgb A1c MFr Bld: 5.9 % (ref 4.6–6.5)

## 2019-11-06 NOTE — Progress Notes (Signed)
Subjective:    Patient ID: Billy Haynes, male    DOB: May 03, 1961, 58 y.o.   MRN: [id number]  HPI This is a 58 yo male who presents today for CPE.  Has been doing well. No complaints today.    Last CPE- 12/19 PSA- 11/02/2018 Colonoscopy- 2/20 Tdap- 11/02/2018 Flu- declines Dental- regular Eye- regular Exercise- has been walking more with his wife, going to the gym  Past Medical History:  Diagnosis Date  . Allergy    Past Surgical History:  Procedure Laterality Date  . meniscus Left 1981  . SHOULDER ARTHROSCOPY W/ ROTATOR CUFF REPAIR Right 02/15/2011  . VASECTOMY     Family History  Problem Relation Age of Onset  . Hyperlipidemia Mother   . Heart disease Father   . Hyperlipidemia Father   . Colon polyps Neg Hx   . Colon cancer Neg Hx   . Esophageal cancer Neg Hx   . Stomach cancer Neg Hx   . Rectal cancer Neg Hx    Social History   Tobacco Use  . Smoking status: Never Smoker  . Smokeless tobacco: Never Used  Substance Use Topics  . Alcohol use: Yes    Alcohol/week: 2.0 standard drinks    Types: 2 Cans of beer per week  . Drug use: No     Review of Systems  Constitutional: Negative.   HENT: Negative.   Eyes: Negative.   Respiratory: Positive for shortness of breath (only with brisk walking).   Cardiovascular: Negative.   Gastrointestinal: Negative.   Endocrine: Negative.   Genitourinary: Negative.   Musculoskeletal: Negative.   Skin: Negative.   Allergic/Immunologic: Negative.   Neurological: Negative.   Hematological: Negative.   Psychiatric/Behavioral: Negative.        Objective:   Physical Exam Vitals reviewed.  Constitutional:      General: He is not in acute distress.    Appearance: Normal appearance. He is obese. He is not ill-appearing, toxic-appearing or diaphoretic.  HENT:     Head: Normocephalic and atraumatic.     Right Ear: Tympanic membrane, ear canal and external ear normal.     Left Ear: Tympanic membrane, ear canal and  external ear normal.  Eyes:     Conjunctiva/sclera: Conjunctivae normal.  Cardiovascular:     Rate and Rhythm: Normal rate and regular rhythm.     Heart sounds: Normal heart sounds.  Pulmonary:     Effort: Pulmonary effort is normal.     Breath sounds: Normal breath sounds.  Abdominal:     General: Bowel sounds are normal. There is no distension.     Palpations: Abdomen is soft. There is no mass.     Tenderness: There is no abdominal tenderness. There is no guarding or rebound.     Hernia: No hernia is present.  Musculoskeletal:     Cervical back: Normal range of motion and neck supple. No rigidity or tenderness.     Right lower leg: No edema.     Left lower leg: No edema.  Lymphadenopathy:     Cervical: No cervical adenopathy.  Skin:    General: Skin is warm and dry.  Neurological:     Mental Status: He is alert and oriented to person, place, and time.  Psychiatric:        Mood and Affect: Mood normal.        Behavior: Behavior normal.        Thought Content: Thought content normal.  Judgment: Judgment normal.       BP 122/78 (BP Location: Left Arm, Patient Position: Sitting, Cuff Size: Normal)   Pulse 65   Temp 97.6 F (36.4 C) (Temporal)   Ht 5\' 10"  (1.778 m)   Wt 236 lb 6.4 oz (107.2 kg)   SpO2 95%   BMI 33.92 kg/m  Wt Readings from Last 3 Encounters:  11/06/19 236 lb 6.4 oz (107.2 kg)  01/12/19 233 lb (105.7 kg)  12/29/18 237 lb (107.5 kg)   BP Readings from Last 3 Encounters:  11/06/19 122/78  01/12/19 107/73  11/02/18 (!) 146/102       Assessment & Plan:  1. Annual physical exam - Discussed and encouraged healthy lifestyle choices- adequate sleep, regular exercise, stress management and healthy food choices.    2. Obesity (BMI 30.0-34.9) - discussed weight loss, provided written information/ resources - Comprehensive metabolic panel  3. Screening for prostate cancer - PSA  4. Prediabetes - Hemoglobin A1c - Lipid Panel - Comprehensive  metabolic panel  This visit occurred during the SARS-CoV-2 public health emergency.  Safety protocols were in place, including screening questions prior to the visit, additional usage of staff PPE, and extensive cleaning of exam room while observing appropriate contact time as indicated for disinfecting solutions.    Clarene Reamer, FNP-BC  [facility]  11/06/2019 5:43 PM

## 2019-11-06 NOTE — Patient Instructions (Signed)
A resource that I like is www.dietdoctor.com/diabetes/diet  Here are some guidelines to help you with meal planning -  Avoid all processed and packaged foods (bread, pasta, crackers, chips, etc) and beverages containing calories.  Avoid added sugars and excessive natural sugars.  Attention to how you feel if you consume artificial sweeteners.  Do they make you more hungry or raise your blood sugar?  With every meal and snack, aim to get 20 g of protein (3 ounces of meat, 4 ounces of fish, 3 eggs, protein powder, 1 cup [place] yogurt, 1 cup cottage cheese, etc.)  Increase fiber in the form of non-starchy vegetables.  These help you feel full with very little carbohydrates and are good for gut health.  Eat 1 serving healthy carb per meal- 1/2 cup brown rice, beans, potato, corn- pay attention to whether or not this significantly raises your blood sugar. If it does, reduce the frequency you consume these.   Eat 2-3 servings of lower sugar fruits daily.  This includes berries, apples, oranges, peaches, pears, one half banana.  Have small amounts of good fats such as avocado, nuts, olive oil, nut butters, olives.  Add a little cheese to your salads to make them tasty.    Health Maintenance, Male Adopting a healthy lifestyle and getting preventive care are important in promoting health and wellness. Ask your health care provider about:  The right schedule for you to have regular tests and exams.  Things you can do on your own to prevent diseases and keep yourself healthy. What should I know about diet, weight, and exercise? Eat a healthy diet   Eat a diet that includes plenty of vegetables, fruits, low-fat dairy products, and lean protein.  Do not eat a lot of foods that are high in solid fats, added sugars, or sodium. Maintain a healthy weight Body mass index (BMI) is a measurement that can be used to identify possible weight problems. It estimates body fat based on height and weight. Your  health care provider can help determine your BMI and help you achieve or maintain a healthy weight. Get regular exercise Get regular exercise. This is one of the most important things you can do for your health. Most adults should:  Exercise for at least 150 minutes each week. The exercise should increase your heart rate and make you sweat (moderate-intensity exercise).  Do strengthening exercises at least twice a week. This is in addition to the moderate-intensity exercise.  Spend less time sitting. Even light physical activity can be beneficial. Watch cholesterol and blood lipids Have your blood tested for lipids and cholesterol at 57 years of age, then have this test every 5 years. You may need to have your cholesterol levels checked more often if:  Your lipid or cholesterol levels are high.  You are older than 57 years of age.  You are at high risk for heart disease. What should I know about cancer screening? Many types of cancers can be detected early and may often be prevented. Depending on your health history and family history, you may need to have cancer screening at various ages. This may include screening for:  Colorectal cancer.  Prostate cancer.  Skin cancer.  Lung cancer. What should I know about heart disease, diabetes, and high blood pressure? Blood pressure and heart disease  High blood pressure causes heart disease and increases the risk of stroke. This is more likely to develop in people who have high blood pressure readings, are of African descent,  or are overweight.  Talk with your health care provider about your target blood pressure readings.  Have your blood pressure checked: ? Every 3-5 years if you are 53-29 years of age. ? Every year if you are 70 years old or older.  If you are between the ages of 63 and 42 and are a current or former smoker, ask your health care provider if you should have a one-time screening for abdominal aortic aneurysm  (AAA). Diabetes Have regular diabetes screenings. This checks your fasting blood sugar level. Have the screening done:  Once every three years after age 5 if you are at a normal weight and have a low risk for diabetes.  More often and at a younger age if you are overweight or have a high risk for diabetes. What should I know about preventing infection? Hepatitis B If you have a higher risk for hepatitis B, you should be screened for this virus. Talk with your health care provider to find out if you are at risk for hepatitis B infection. Hepatitis C Blood testing is recommended for:  Everyone born from [birth year] through 1964.  Anyone with known risk factors for hepatitis C. Sexually transmitted infections (STIs)  You should be screened each year for STIs, including gonorrhea and chlamydia, if: ? You are sexually active and are younger than 57 years of age. ? You are older than 57 years of age and your health care provider tells you that you are at risk for this type of infection. ? Your sexual activity has changed since you were last screened, and you are at increased risk for chlamydia or gonorrhea. Ask your health care provider if you are at risk.  Ask your health care provider about whether you are at high risk for HIV. Your health care provider may recommend a prescription medicine to help prevent HIV infection. If you choose to take medicine to prevent HIV, you should first get tested for HIV. You should then be tested every 3 months for as long as you are taking the medicine. Follow these instructions at home: Lifestyle  Do not use any products that contain nicotine or tobacco, such as cigarettes, e-cigarettes, and chewing tobacco. If you need help quitting, ask your health care provider.  Do not use street drugs.  Do not share needles.  Ask your health care provider for help if you need support or information about quitting drugs. Alcohol use  Do not drink alcohol if your health  care provider tells you not to drink.  If you drink alcohol: ? Limit how much you have to 0-2 drinks a day. ? Be aware of how much alcohol is in your drink. In the [place] one drink equals one 12 oz bottle of beer (355 mL), one 5 oz glass of wine (148 mL), or one 1 oz glass of hard liquor (44 mL). General instructions  Schedule regular health, dental, and eye exams.  Stay current with your vaccines.  Tell your health care provider if: ? You often feel depressed. ? You have ever been abused or do not feel safe at home. Summary  Adopting a healthy lifestyle and getting preventive care are important in promoting health and wellness.  Follow your health care provider's instructions about healthy diet, exercising, and getting tested or screened for diseases.  Follow your health care provider's instructions on monitoring your cholesterol and blood pressure. This information is not intended to replace advice given to you by your health care provider. Make sure  you discuss any questions you have with your health care provider. Document Released: 05/07/2008 Document Revised: 11/02/2018 Document Reviewed: 11/02/2018 Elsevier Patient Education  2020 [employer].

## 2019-11-08 NOTE — Addendum Note (Signed)
Addended by: Clarene Reamer B on: 11/08/2019 03:26 PM   Modules accepted: Orders

## 2020-04-10 DIAGNOSIS — L812 Freckles: Secondary | ICD-10-CM | POA: Diagnosis not present

## 2020-04-10 DIAGNOSIS — D225 Melanocytic nevi of trunk: Secondary | ICD-10-CM | POA: Diagnosis not present

## 2020-04-10 DIAGNOSIS — L821 Other seborrheic keratosis: Secondary | ICD-10-CM | POA: Diagnosis not present

## 2020-04-10 DIAGNOSIS — D1801 Hemangioma of skin and subcutaneous tissue: Secondary | ICD-10-CM | POA: Diagnosis not present

## 2020-05-06 ENCOUNTER — Other Ambulatory Visit (INDEPENDENT_AMBULATORY_CARE_PROVIDER_SITE_OTHER): Payer: BC Managed Care – PPO

## 2020-05-06 ENCOUNTER — Other Ambulatory Visit: Payer: Self-pay

## 2020-05-06 DIAGNOSIS — E782 Mixed hyperlipidemia: Secondary | ICD-10-CM

## 2020-05-07 LAB — LIPID PANEL
Chol/HDL Ratio: 6.6 ratio — ABNORMAL HIGH (ref 0.0–5.0)
Cholesterol, Total: 232 mg/dL — ABNORMAL HIGH (ref 100–199)
HDL: 35 mg/dL — ABNORMAL LOW (ref >39)
LDL Chol Calc (NIH): 174 mg/dL — ABNORMAL HIGH (ref 0–99)
Triglycerides: 128 mg/dL (ref 0–149)
VLDL Cholesterol Cal: 23 mg/dL (ref 5–40)

## 2020-05-10 MED ORDER — ATORVASTATIN CALCIUM 20 MG PO TABS: 20.0000 mg | ORAL_TABLET | Freq: Every day | ORAL | 3 refills | Status: DC

## 2020-05-10 NOTE — Progress Notes (Signed)
LM

## 2020-05-10 NOTE — Addendum Note (Signed)
Addended by: Clarene Reamer B on: 05/10/2020 02:04 PM   Modules accepted: Orders

## 2020-07-24 ENCOUNTER — Telehealth: Payer: Self-pay

## 2020-07-24 NOTE — Telephone Encounter (Signed)
I spoke with pt; pt did not go anywhere to be seen; today pt said the urine is clear today; pt has slight pain on rt lower back like after he has passed a kidney stone. Pt has hx of kidney stone. Pt is going to continue to drink water and cranberry juice and if pain does not go away pt will cb for appt. FYI to Glenda Chroman FNP.

## 2020-07-24 NOTE — Telephone Encounter (Signed)
Noted  

## 2020-07-24 NOTE — Telephone Encounter (Signed)
[facility] - Client TELEPHONE ADVICE RECORD [facility] Patient Name: BRIEN LOWE Gender: Male DOB: [birth year] Age: 59 Y 40 M 15 D Return Phone Number: [phone] (Primary) [place] [address] Client [facility] - Client Client Site [place] - Day Physician Tor Netters- NP Contact Type Call Who Is Calling Patient / Member / Family / Caregiver Call Type Triage / Clinical Relationship To Patient Self Return Phone Number [phone] (Primary) Chief Complaint Urine, Blood In Reason for Call Symptomatic / Request for [place] states he has dark urine with blood in it. Additional Comment No pain. Translation No Nurse Assessment Nurse: Donna Christen, RN, Legrand Como Date/Time Eilene Ghazi Time): 07/23/2020 3:41:58 PM Confirm and document reason for call. If symptomatic, describe symptoms. ---Caller states he has dark urine with blood in it that started today. Has gotten more clear after drinking water. No pain or burning with urination. Has the patient had close contact with a person known or suspected to have the novel coronavirus illness OR traveled / lives in area with major community spread (including international travel) in the last 14 days from the onset of symptoms? * If Asymptomatic, screen for exposure and travel within the last 14 days. ---No Does the patient have any new or worsening symptoms? ---Yes Will a triage be completed? ---Yes Related visit to physician within the last 2 weeks? ---No Does the PT have any chronic conditions? (i.e. diabetes, asthma, this includes High risk factors for pregnancy, etc.) ---No Is this a behavioral health or substance abuse call? ---No Guidelines Guideline Title Affirmed Question Affirmed Notes Nurse Date/Time (Eastern Time) Urine - Blood In Blood in urine (Exception: could be normal  menstrual bleeding) ATHONY, COPPA 07/23/2020 3:43:04 PM Disp. Time Eilene Ghazi Time) Disposition Final User 07/23/2020 3:46:52 PM See PCP within 24 Hours Yes Donna Christen, RN, Legrand Como PLEASE NOTE: All timestamps contained within this report are represented as [place] Standard Time. CONFIDENTIALTY NOTICE: This fax transmission is intended only for the addressee. It contains information that is legally privileged, confidential or otherwise protected from use or disclosure. If you are not the intended recipient, you are strictly prohibited from reviewing, disclosing, copying using or disseminating any of this information or taking any action in reliance on or regarding this information. If you have received this fax in error, please notify us immediately by telephone so that we can arrange for its return to [place]. Phone: [phone], Toll-Free: [phone], Fax: [phone] Page: 2 of 2 Call Id: [id number] [place] Disagree/Comply Comply Caller Understands Yes PreDisposition Call Doctor Care Advice Given Per Guideline SEE PCP WITHIN 24 HOURS: CALL BACK IF: * Fever or pain occurs * You become worse. CARE ADVICE given per Urine, Blood In (Adult) guideline. Referrals REFERRED TO PCP OFFICE

## 2020-08-05 ENCOUNTER — Encounter: Payer: Self-pay | Admitting: Family Medicine

## 2020-08-05 ENCOUNTER — Ambulatory Visit (INDEPENDENT_AMBULATORY_CARE_PROVIDER_SITE_OTHER): Payer: BC Managed Care – PPO | Admitting: Family Medicine

## 2020-08-05 ENCOUNTER — Other Ambulatory Visit: Payer: Self-pay

## 2020-08-05 VITALS — BP 140/78 | HR 65 | Temp 98.0°F | Ht 70.0 in | Wt 227.2 lb

## 2020-08-05 DIAGNOSIS — R310 Gross hematuria: Secondary | ICD-10-CM

## 2020-08-05 DIAGNOSIS — R109 Unspecified abdominal pain: Secondary | ICD-10-CM

## 2020-08-05 LAB — POC URINALSYSI DIPSTICK (AUTOMATED)
Bilirubin, UA: NEGATIVE
Blood, UA: NEGATIVE
Glucose, UA: NEGATIVE
Ketones, UA: NEGATIVE
Leukocytes, UA: NEGATIVE
Nitrite, UA: NEGATIVE
Protein, UA: NEGATIVE
Spec Grav, UA: >=1.03 — AB (ref 1.010–1.025)
Urobilinogen, UA: 0.2 E.U./dL
pH, UA: 6 (ref 5.0–8.0)

## 2020-08-05 NOTE — Progress Notes (Signed)
Tyric Rodeheaver T. Broadus Costilla, MD, [place]  [facility] and [place] at [facility] [place], [address]  Phone: [phone]  FAX: [phone]  CHANCE MUNTER - 59 y.o. male  MRN [id number]  Date of Birth: [birth year]  Date: 08/05/2020  PCP: Elby Beck, FNP  Referral: Elby Beck, FNP  Chief Complaint  Patient presents with  . Flank Pain    Right  . Hematuria    last week    This visit occurred during the SARS-CoV-2 public health emergency.  Safety protocols were in place, including screening questions prior to the visit, additional usage of staff PPE, and extensive cleaning of exam room while observing appropriate contact time as indicated for disinfecting solutions.   Subjective:   DAYVEON HALLEY is a 59 y.o. very pleasant male patient with Body mass index is 32.61 kg/m. who presents with the following:  Last week he had some gross hematuria.  Drank a bunch of water and three beers and it cleared up.  He does have a history of kidney stones in the past, and this does not feel like that in any way.  Did have some mild flank pain, but this was the extent of his symptoms.  At this point and never did he have any fever, chills, or significant dysuria or urgency.  He does not have a history of UTI and he has not had any potential STD exposure.  SInce then, when it will feel like he has some pressure in hs kidney.  ? If from passing a kidney stone.     Review of Systems is noted in the HPI, as appropriate  Objective:   BP 140/78   Pulse 65   Temp 98 F (36.7 C) (Temporal)   Ht 5\' 10"  (1.778 m)   Wt 227 lb 4 oz (103.1 kg)   SpO2 98%   BMI 32.61 kg/m   GEN: No acute distress; alert,appropriate. PULM: Breathing comfortably in no respiratory distress PSYCH: Normally interactive.  ABD: S, NT, ND, + BS, No rebound, No HSM  No CVA tenderness  Laboratory and Imaging Data: Results for  orders placed or performed in visit on 08/05/20  POCT Urinalysis Dipstick (Automated)  Result Value Ref Range   Color, UA Yellow    Clarity, UA Clear    Glucose, UA Negative Negative   Bilirubin, UA Negative    Ketones, UA Negative    Spec Grav, UA >=1.030 (A) 1.010 - 1.025   Blood, UA Negative    pH, UA 6.0 5.0 - 8.0   Protein, UA Negative Negative   Urobilinogen, UA 0.2 0.2 or 1.0 E.U./dL   Nitrite, UA Negative    Leukocytes, UA Negative Negative     Assessment and Plan:     ICD-10-CM   1. Hematuria, gross  R31.0 Ambulatory referral to Urology  2. Right flank pain  R10.9 POCT Urinalysis Dipstick (Automated)   Gross hematuria in a setting without UTI, and no clear kidney stone.  This deserves a further work-up to evaluate for potential neoplasm, and I am going to consult urology.  Follow-up: No follow-ups on file.  No orders of the defined types were placed in this encounter.  There are no discontinued medications. Orders Placed This Encounter  Procedures  . Ambulatory referral to Urology  . POCT Urinalysis Dipstick (Automated)    Signed,  Laurie Penado T. Jeryn Bertoni, MD   Outpatient Encounter Medications as of 08/05/2020  Medication Sig  . Ascorbic Acid (VITA-C PO) Take 1 tablet by mouth daily.  Marland Kitchen aspirin EC 81 MG tablet Take 81 mg by mouth as needed.  . Cholecalciferol (VITAMIN D3 PO) Take 1 tablet by mouth daily.  Marland Kitchen ibuprofen (ADVIL,MOTRIN) 800 MG tablet Take 800 mg by mouth once a week.  . Multiple Vitamins-Minerals (ZINC PO) Take 1 tablet by mouth daily.  Marland Kitchen NIACIN PO Take 1 tablet by mouth daily.  Marland Kitchen atorvastatin (LIPITOR) 20 MG tablet Take 1 tablet (20 mg total) by mouth daily. (Patient not taking: Reported on 08/05/2020)   No facility-administered encounter medications on file as of 08/05/2020.

## 2020-10-29 DIAGNOSIS — U071 COVID-19: Secondary | ICD-10-CM | POA: Diagnosis not present

## 2020-11-04 ENCOUNTER — Encounter: Payer: BC Managed Care – PPO | Admitting: Family Medicine

## 2020-12-04 ENCOUNTER — Encounter: Payer: BC Managed Care – PPO | Admitting: Family Medicine

## 2021-03-13 DIAGNOSIS — M25561 Pain in right knee: Secondary | ICD-10-CM | POA: Diagnosis not present

## 2021-03-18 DIAGNOSIS — M25561 Pain in right knee: Secondary | ICD-10-CM | POA: Diagnosis not present

## 2021-03-19 DIAGNOSIS — M1711 Unilateral primary osteoarthritis, right knee: Secondary | ICD-10-CM | POA: Diagnosis not present

## 2021-03-19 DIAGNOSIS — S86101A Unspecified injury of other muscle(s) and tendon(s) of posterior muscle group at lower leg level, right leg, initial encounter: Secondary | ICD-10-CM | POA: Diagnosis not present

## 2021-05-13 DIAGNOSIS — D225 Melanocytic nevi of trunk: Secondary | ICD-10-CM | POA: Diagnosis not present

## 2021-05-13 DIAGNOSIS — D1801 Hemangioma of skin and subcutaneous tissue: Secondary | ICD-10-CM | POA: Diagnosis not present

## 2021-05-13 DIAGNOSIS — L821 Other seborrheic keratosis: Secondary | ICD-10-CM | POA: Diagnosis not present

## 2021-05-13 DIAGNOSIS — L812 Freckles: Secondary | ICD-10-CM | POA: Diagnosis not present

## 2021-08-19 ENCOUNTER — Other Ambulatory Visit: Payer: Self-pay

## 2021-08-19 ENCOUNTER — Ambulatory Visit (INDEPENDENT_AMBULATORY_CARE_PROVIDER_SITE_OTHER): Payer: BC Managed Care – PPO | Admitting: Nurse Practitioner

## 2021-08-19 ENCOUNTER — Encounter: Payer: Self-pay | Admitting: Nurse Practitioner

## 2021-08-19 VITALS — BP 130/90 | HR 66 | Temp 98.0°F | Resp 18 | Ht 70.0 in | Wt 229.1 lb

## 2021-08-19 DIAGNOSIS — Z125 Encounter for screening for malignant neoplasm of prostate: Secondary | ICD-10-CM | POA: Insufficient documentation

## 2021-08-19 DIAGNOSIS — Z0000 Encounter for general adult medical examination without abnormal findings: Secondary | ICD-10-CM

## 2021-08-19 DIAGNOSIS — K4090 Unilateral inguinal hernia, without obstruction or gangrene, not specified as recurrent: Secondary | ICD-10-CM | POA: Insufficient documentation

## 2021-08-19 NOTE — Assessment & Plan Note (Signed)
Discussed appropriate vaccinations, preventative health test, and lifestyle modifications for age.  Patient seems to eat a balanced diet and does exercise daily.  Continue pending lab results.  Last colonoscopy 2020 with follow-up in 5 years to be due in 2025.

## 2021-08-19 NOTE — Patient Instructions (Addendum)
Nice to see you today Will get labs and will be in touch Will place the order for [facility].

## 2021-08-19 NOTE — Progress Notes (Signed)
Established Patient Office Visit  Subjective:  Patient ID: Billy Haynes, male    DOB: 10/21/1961  Age: 60 y.o. MRN: [id number]  CC:  Chief Complaint  Patient presents with   Transfer of Care   Constipation    Extra gasey, slim shape of bowel movements, feels a lump in the lower left abdomen area that moves with pushing on it. Sx have been present for about over a week    HPI Billy Haynes presents for complete physical and follow up of chronic conditions.  Immunizations: -Tetanus: UTD 11/02/2018 -Influenza: Refused -Covid-19: Refused -Shingles: Refused -Pneumonia: Na  -HPV: NA  Diet: Fair diet. Varity of food. Portion sizes sound appropriate Exercise:  Walks daily for approx 30 mins min  Eye exam: Never. Readers on occasion Dental exam: Completes semi-annually     Dexa: NA Colonoscopy: Completed in 2020 PSA: Ordered today  Lung Cancer Screening: never smoked doesn't smoke.   Past Medical History:  Diagnosis Date   Allergy     Past Surgical History:  Procedure Laterality Date   meniscus Left 1981   SHOULDER ARTHROSCOPY W/ ROTATOR CUFF REPAIR Right 02/15/2011   VASECTOMY      Family History  Problem Relation Age of Onset   Hyperlipidemia Mother    Heart disease Father    Hyperlipidemia Father    Colon polyps Neg Hx    Colon cancer Neg Hx    Esophageal cancer Neg Hx    Stomach cancer Neg Hx    Rectal cancer Neg Hx     Social History   Socioeconomic History   Marital status: Married    Spouse name: Not on file   Number of children: Not on file   Years of education: Not on file   Highest education level: Not on file  Occupational History   Not on file  Tobacco Use   Smoking status: Never   Smokeless tobacco: Never  Substance and Sexual Activity   Alcohol use: Yes    Alcohol/week: 2.0 standard drinks    Types: 2 Cans of beer per week   Drug use: No   Sexual activity: Not on file  Other Topics Concern   Not on file  Social History  Narrative   Not on file   Social Determinants of Health   Financial Resource Strain: Not on file  Food Insecurity: Not on file  Transportation Needs: Not on file  Physical Activity: Not on file  Stress: Not on file  Social Connections: Not on file  Intimate Partner Violence: Not on file    Outpatient Medications Prior to Visit  Medication Sig Dispense Refill   Ascorbic Acid (VITA-C PO) Take 1 tablet by mouth daily.     Cholecalciferol (VITAMIN D3 PO) Take 1 tablet by mouth daily.     ibuprofen (ADVIL,MOTRIN) 800 MG tablet Take 800 mg by mouth once a week.     Multiple Vitamins-Minerals (ZINC PO) Take 1 tablet by mouth daily.     NIACIN PO Take 1 tablet by mouth daily.     aspirin EC 81 MG tablet Take 81 mg by mouth as needed.     atorvastatin (LIPITOR) 20 MG tablet Take 1 tablet (20 mg total) by mouth daily. (Patient not taking: Reported on 08/19/2021) 90 tablet 3   No facility-administered medications prior to visit.    No Known Allergies  ROS Review of Systems  Constitutional:  Negative for chills, fatigue and fever.  Eyes:  Negative for visual disturbance.  Respiratory:  Negative for cough and shortness of breath.   Cardiovascular:  Negative for chest pain, palpitations and leg swelling.  Gastrointestinal:  Negative for blood in stool, diarrhea, nausea and vomiting.  Genitourinary:  Negative for dysuria, hematuria, penile discharge, penile pain, penile swelling, scrotal swelling and testicular pain.       Nocturia negative  Neurological:  Negative for dizziness, weakness, light-headedness and headaches.  Psychiatric/Behavioral:  Negative for hallucinations and suicidal ideas.      Objective:    Physical Exam Vitals and nursing note reviewed. Exam conducted with a chaperone present [facility], CMA).  Constitutional:      Appearance: Normal appearance.  HENT:     Right Ear: Tympanic membrane, ear canal and external ear normal. There is no impacted cerumen.      Left Ear: Tympanic membrane, ear canal and external ear normal. There is no impacted cerumen.     Mouth/Throat:     Mouth: Mucous membranes are moist.     Pharynx: Oropharynx is clear.  Eyes:     Extraocular Movements: Extraocular movements intact.     Pupils: Pupils are equal, round, and reactive to light.  Neck:     Thyroid: No thyroid mass, thyromegaly or thyroid tenderness.     Vascular: No carotid bruit.  Cardiovascular:     Rate and Rhythm: Normal rate and regular rhythm.     Pulses: Normal pulses.          Radial pulses are 2+ on the right side and 2+ on the left side.       Dorsalis pedis pulses are 2+ on the right side and 2+ on the left side.       Posterior tibial pulses are 2+ on the right side and 2+ on the left side.  Pulmonary:     Effort: Pulmonary effort is normal.     Breath sounds: Normal breath sounds.  Abdominal:     General: Bowel sounds are normal. There is no distension.     Palpations: There is no mass.     Tenderness: There is no abdominal tenderness.     Hernia: A hernia is present. Hernia is present in the left inguinal area.  Genitourinary:    Penis: Normal.      Testes: Normal.    Musculoskeletal:     Right lower leg: No edema.     Left lower leg: No edema.  Lymphadenopathy:     Cervical: No cervical adenopathy.  Skin:    General: Skin is warm.  Neurological:     Mental Status: He is alert.     Motor: No weakness.     Deep Tendon Reflexes:     Reflex Scores:      Bicep reflexes are 1+ on the right side and 1+ on the left side.      Patellar reflexes are 2+ on the right side and 2+ on the left side.    Comments: 5/5 bilateral upper and lower extremity strength.     BP 130/90   Pulse 66   Temp 98 F (36.7 C)   Resp 18   Ht 5\' 10"  (1.778 m)   Wt 229 lb 1 oz (103.9 kg)   SpO2 97%   BMI 32.87 kg/m  Wt Readings from Last 3 Encounters:  08/19/21 229 lb 1 oz (103.9 kg)  08/05/20 227 lb 4 oz (103.1 kg)  11/06/19 236 lb 6.4 oz (107.2  kg)     Health Maintenance Due  Topic Date Due   HIV Screening  Never done   Hepatitis C Screening  Never done    There are no preventive care reminders to display for this patient.  No results found for: TSH Lab Results  Component Value Date   WBC 6.4 11/02/2018   HGB 15.9 11/02/2018   HCT 46.4 11/02/2018   MCV 85.2 11/02/2018   PLT 238.0 11/02/2018   Lab Results  Component Value Date   NA 140 11/06/2019   K 4.3 11/06/2019   CO2 27 11/06/2019   GLUCOSE 103 (H) 11/06/2019   BUN 19 11/06/2019   CREATININE 1.07 11/06/2019   BILITOT 0.5 11/06/2019   ALKPHOS 82 11/06/2019   AST 18 11/06/2019   ALT 19 11/06/2019   PROT 6.6 11/06/2019   ALBUMIN 4.1 11/06/2019   CALCIUM 9.2 11/06/2019   GFR 71.24 11/06/2019   Lab Results  Component Value Date   CHOL 232 (H) 05/06/2020   Lab Results  Component Value Date   HDL 35 (L) 05/06/2020   Lab Results  Component Value Date   LDLCALC 174 (H) 05/06/2020   Lab Results  Component Value Date   TRIG 128 05/06/2020   Lab Results  Component Value Date   CHOLHDL 6.6 (H) 05/06/2020   Lab Results  Component Value Date   HGBA1C 5.9 11/06/2019      Assessment & Plan:   Problem List Items Addressed This Visit       Other   Screening for prostate cancer   Relevant Orders   PSA   Non-recurrent unilateral inguinal hernia without obstruction or gangrene    Assumed hernia in the left inguinal area.  We will order ultrasound to rule in or out hernia.  Patient does nonstrenuous job.  Does state some bowel habit changes no blood or mucus in stool.  Pending results      Relevant Orders   US Pelvis Limited   Preventative health care - Primary    Discussed appropriate vaccinations, preventative health test, and lifestyle modifications for age.  Patient seems to eat a balanced diet and does exercise daily.  Continue pending lab results.  Last colonoscopy 2020 with follow-up in 5 years to be due in 2025.      Relevant Orders    CBC   Comprehensive metabolic panel   Hemoglobin A1c   Lipid panel   TSH    No orders of the defined types were placed in this encounter.   Follow-up: Return in about 1 year (around 08/19/2022) for CPE .   This visit occurred during the SARS-CoV-2 public health emergency.  Safety protocols were in place, including screening questions prior to the visit, additional usage of staff PPE, and extensive cleaning of exam room while observing appropriate contact time as indicated for disinfecting solutions.   Romilda Garret, NP

## 2021-08-19 NOTE — Assessment & Plan Note (Signed)
Assumed hernia in the left inguinal area.  We will order ultrasound to rule in or out hernia.  Patient does nonstrenuous job.  Does state some bowel habit changes no blood or mucus in stool.  Pending results

## 2021-08-20 LAB — COMPREHENSIVE METABOLIC PANEL
ALT: 21 IU/L (ref 0–44)
AST: 26 IU/L (ref 0–40)
Albumin/Globulin Ratio: 1.9 (ref 1.2–2.2)
Albumin: 4.7 g/dL (ref 3.8–4.9)
Alkaline Phosphatase: 94 IU/L (ref 44–121)
BUN/Creatinine Ratio: 13 (ref 9–20)
BUN: 14 mg/dL (ref 6–24)
Bilirubin Total: 0.4 mg/dL (ref 0.0–1.2)
CO2: 25 mmol/L (ref 20–29)
Calcium: 9.6 mg/dL (ref 8.7–10.2)
Chloride: 102 mmol/L (ref 96–106)
Creatinine, Ser: 1.06 mg/dL (ref 0.76–1.27)
Globulin, Total: 2.5 g/dL (ref 1.5–4.5)
Glucose: 96 mg/dL (ref 70–99)
Potassium: 4.4 mmol/L (ref 3.5–5.2)
Sodium: 141 mmol/L (ref 134–144)
Total Protein: 7.2 g/dL (ref 6.0–8.5)
eGFR: 81 mL/min/{1.73_m2} (ref >59)

## 2021-08-20 LAB — CBC
Hematocrit: 46.9 % (ref 37.5–51.0)
Hemoglobin: 15.8 g/dL (ref 13.0–17.7)
MCH: 29 pg (ref 26.6–33.0)
MCHC: 33.7 g/dL (ref 31.5–35.7)
MCV: 86 fL (ref 79–97)
Platelets: 243 10*3/uL (ref 150–450)
RBC: 5.45 x10E6/uL (ref 4.14–5.80)
RDW: 13 % (ref 11.6–15.4)
WBC: 7.3 10*3/uL (ref 3.4–10.8)

## 2021-08-20 LAB — TSH: TSH: 2.57 u[IU]/mL (ref 0.450–4.500)

## 2021-08-20 LAB — LIPID PANEL
Chol/HDL Ratio: 6.3 ratio — ABNORMAL HIGH (ref 0.0–5.0)
Cholesterol, Total: 284 mg/dL — ABNORMAL HIGH (ref 100–199)
HDL: 45 mg/dL (ref >39)
LDL Chol Calc (NIH): 214 mg/dL — ABNORMAL HIGH (ref 0–99)
Triglycerides: 134 mg/dL (ref 0–149)
VLDL Cholesterol Cal: 25 mg/dL (ref 5–40)

## 2021-08-20 LAB — HEMOGLOBIN A1C
Est. average glucose Bld gHb Est-mCnc: 126 mg/dL
Hgb A1c MFr Bld: 6 % — ABNORMAL HIGH (ref 4.8–5.6)

## 2021-08-21 LAB — PSA: Prostate Specific Ag, Serum: 0.7 ng/mL (ref 0.0–4.0)

## 2021-08-21 LAB — SPECIMEN STATUS REPORT

## 2021-08-22 ENCOUNTER — Other Ambulatory Visit: Payer: Self-pay

## 2021-08-22 ENCOUNTER — Ambulatory Visit
Admission: RE | Admit: 2021-08-22 | Discharge: 2021-08-22 | Disposition: A | Discharge status: Home / Self Care | Payer: BC Managed Care – PPO | Source: Ambulatory Visit | Attending: Nurse Practitioner | Admitting: Nurse Practitioner

## 2021-08-22 ENCOUNTER — Other Ambulatory Visit: Payer: Self-pay | Admitting: Nurse Practitioner

## 2021-08-22 DIAGNOSIS — E7800 Pure hypercholesterolemia, unspecified: Secondary | ICD-10-CM

## 2021-08-22 DIAGNOSIS — K4090 Unilateral inguinal hernia, without obstruction or gangrene, not specified as recurrent: Secondary | ICD-10-CM

## 2021-08-22 DIAGNOSIS — Z0389 Encounter for observation for other suspected diseases and conditions ruled out: Secondary | ICD-10-CM | POA: Diagnosis not present

## 2021-08-22 MED ORDER — ROSUVASTATIN CALCIUM 10 MG PO TABS: 10.0000 mg | ORAL_TABLET | Freq: Every day | ORAL | 0 refills | Status: DC

## 2021-09-30 ENCOUNTER — Telehealth: Payer: Self-pay | Admitting: Nurse Practitioner

## 2021-09-30 ENCOUNTER — Other Ambulatory Visit: Payer: Self-pay | Admitting: Nurse Practitioner

## 2021-09-30 DIAGNOSIS — K4090 Unilateral inguinal hernia, without obstruction or gangrene, not specified as recurrent: Secondary | ICD-10-CM

## 2021-09-30 NOTE — Telephone Encounter (Signed)
Dr Armandina Gemma is who pt wants to see for his hernia. He is with [facility]

## 2021-09-30 NOTE — Telephone Encounter (Signed)
Patient was seen on 08/19/21 and [place] was done for hernia. At that time patient wanted to follow and wait but has decided to proceed with the referral now. Sounds like per the phone note from [place] that patient has a provider in mind, left detailed message asking patient to call back with a name or location of where he would like to go to so we can then put an order in. ([place] or somewhere else)

## 2021-09-30 NOTE — Telephone Encounter (Signed)
Patient's wife advised, ok per DPR on file.

## 2021-09-30 NOTE — Telephone Encounter (Signed)
Pt wants a referral for a hernia surgery. Pt has already been seen for this a month ago with provider. Pt has already have a doctor for this

## 2021-09-30 NOTE — Telephone Encounter (Signed)
Order placed

## 2021-10-31 ENCOUNTER — Ambulatory Visit: Payer: Self-pay | Admitting: Surgery

## 2021-10-31 DIAGNOSIS — K4090 Unilateral inguinal hernia, without obstruction or gangrene, not specified as recurrent: Secondary | ICD-10-CM | POA: Diagnosis not present

## 2021-11-12 DIAGNOSIS — L720 Epidermal cyst: Secondary | ICD-10-CM | POA: Diagnosis not present

## 2021-11-26 ENCOUNTER — Other Ambulatory Visit: Payer: BC Managed Care – PPO

## 2021-11-26 ENCOUNTER — Encounter (HOSPITAL_BASED_OUTPATIENT_CLINIC_OR_DEPARTMENT_OTHER): Payer: Self-pay | Admitting: Surgery

## 2021-11-26 ENCOUNTER — Other Ambulatory Visit: Payer: Self-pay

## 2021-11-28 NOTE — Progress Notes (Signed)

## 2021-12-07 ENCOUNTER — Encounter (HOSPITAL_BASED_OUTPATIENT_CLINIC_OR_DEPARTMENT_OTHER): Payer: Self-pay | Admitting: Surgery

## 2021-12-07 NOTE — H&P (Signed)
REFERRING PHYSICIAN: Michela Pitcher  PROVIDER: Sharalyn Lomba Charlotta Newton, MD  Chief Complaint: New Consultation (Left inguinal hernia)   History of Present Illness:  Patient is referred by Billy Haynes for surgical evaluation and management of newly diagnosed left inguinal hernia. Patient first noted discomfort and a bulge in the left groin in September 2022. He was evaluated by his primary care provider and diagnosed with left inguinal hernia. Patient underwent an ultrasound which confirmed the finding of left inguinal hernia. Patient has had no prior abdominal surgery. He has had no prior hernia repairs. He has noted some intermittent episodes of constipation recently for which she has taken Dulcolax with relief. Hernia has always been reducible when the patient lies recumbent. He has had no other signs or symptoms of obstruction. Patient presents today accompanied by his wife to discuss hernia repair. Patient's cousin is a Immunologist at [employer] [facility].  Review of Systems: A complete review of systems was obtained from the patient. I have reviewed this information and discussed as appropriate with the patient. See HPI as well for other ROS.  Review of Systems  Constitutional: Negative.  HENT: Negative.  Eyes: Negative.  Respiratory: Negative.  Cardiovascular: Negative.  Gastrointestinal: Positive for constipation.  Genitourinary: Negative.  Left groin pain  Musculoskeletal: Negative.  Skin: Negative.  Neurological: Negative.  Endo/Heme/Allergies: Negative.  Psychiatric/Behavioral: Negative.    Medical History: Past Medical History:  Diagnosis Date   Hyperlipidemia   Patient Active Problem List  Diagnosis   Left inguinal hernia   Past Surgical History:  Procedure Laterality Date   knee surgery 11/1979   shoulder surgery 01/2011    No Known Allergies  No current outpatient medications on file prior to visit.   No current facility-administered medications on file prior to  visit.   Family History  Problem Relation Age of Onset   Hyperlipidemia (Elevated cholesterol) Mother   High blood pressure (Hypertension) Mother   High blood pressure (Hypertension) Father   Hyperlipidemia (Elevated cholesterol) Father    Social History   Tobacco Use  Smoking Status Never  Smokeless Tobacco Never    Social History   Socioeconomic History   Marital status: Married  Tobacco Use   Smoking status: Never   Smokeless tobacco: Never  Substance and Sexual Activity   Alcohol use: Yes   Drug use: Never   Objective:   Vitals:  BP: (!) 142/86  Pulse: 95  Temp: 36.8 C (98.2 F)  SpO2: 97%  Weight: (!) 107.3 kg (236 lb 9.6 oz)  Height: 177.8 cm (5\' 10" )   Body mass index is 33.95 kg/m.  Physical Exam   GENERAL APPEARANCE Development: normal Nutritional status: normal Gross deformities: none  SKIN Rash, lesions, ulcers: none Induration, erythema: none Nodules: none palpable  EYES Conjunctiva and lids: normal Pupils: equal and reactive Iris: normal bilaterally  EARS, NOSE, MOUTH, THROAT External ears: no lesion or deformity External nose: no lesion or deformity Hearing: grossly normal Due to Covid-19 pandemic, patient is wearing a mask.  NECK Symmetric: yes Trachea: midline Thyroid: no palpable nodules in the thyroid bed  CHEST Respiratory effort: normal Retraction or accessory muscle use: no Breath sounds: normal bilaterally Rales, rhonchi, wheeze: none  CARDIOVASCULAR Auscultation: regular rhythm, normal rate Murmurs: none Pulses: radial pulse 2+ palpable Lower extremity edema: none  ABDOMEN Distension: none Masses: none palpable Tenderness: none Hepatosplenomegaly: not present Hernia: not present  GENITOURINARY Penis: no lesions Scrotum: no masses Patient in the right inguinal canal with  cough and Valsalva shows no sign of hernia. On the left side there is a visible bulge. On palpation there is a moderate to large left  inguinal hernia. This is partially reducible with manipulation. It augments with cough and Valsalva.  MUSCULOSKELETAL Station and gait: normal Digits and nails: no clubbing or cyanosis Muscle strength: grossly normal all extremities Range of motion: grossly normal all extremities Deformity: none  LYMPHATIC Cervical: none palpable Supraclavicular: none palpable  PSYCHIATRIC Oriented to person, place, and time: yes Mood and affect: normal for situation Judgment and insight: appropriate for situation  Assessment and Plan:   Left inguinal hernia   Patient presents today on referral from his primary care provider for surgical evaluation and management of a left inguinal hernia. Patient is provided with written literature on hernia surgery to review at home.  Patient has a reducible left inguinal hernia which is mildly symptomatic. He has had intermittent episodes of constipation. Patient has had no prior abdominal surgery and no prior hernia repairs. I have recommended proceeding with an open left inguinal hernia repair with mesh as the procedure of choice. This has the lowest risk of recurrence. We discussed the operative repair, we discussed the benefit of open repair versus laparoscopic repair, and we discussed the restrictions on his activities after the procedure. The patient understands and wishes to proceed with surgery in the near future.  The risks and benefits of the procedure have been discussed at length with the patient. The patient understands the proposed procedure, potential alternative treatments, and the course of recovery to be expected. All of the patient's questions have been answered at this time. The patient wishes to proceed with surgery.  Armandina Gemma, MD [facility] [place] practice Office: [phone]

## 2021-12-07 NOTE — Anesthesia Preprocedure Evaluation (Addendum)
Anesthesia Evaluation  Patient identified by MRN, date of birth, ID band Patient awake    Reviewed: Allergy & Precautions, NPO status , Patient's Chart, lab work & pertinent test results  Airway Mallampati: II  TM Distance: >3 FB Neck ROM: Full    Dental no notable dental hx. (+) Teeth Intact, Dental Advisory Given   Pulmonary neg pulmonary ROS,    Pulmonary exam normal breath sounds clear to auscultation       Cardiovascular Exercise Tolerance: Good negative cardio ROS Normal cardiovascular exam Rhythm:Regular Rate:Normal     Neuro/Psych negative neurological ROS  negative psych ROS   GI/Hepatic negative GI ROS, Neg liver ROS,   Endo/Other  negative endocrine ROS  Renal/GU   negative genitourinary   Musculoskeletal negative musculoskeletal ROS (+)   Abdominal (+) + obese (BMI 32),   Peds  Hematology   Anesthesia Other Findings NKA  Reproductive/Obstetrics                           Anesthesia Physical Anesthesia Plan  ASA: 2  Anesthesia Plan: General and Regional   Post-op Pain Management: Toradol IV (intra-op) and Regional block   Induction: Intravenous  PONV Risk Score and Plan: 3 and Midazolam, Ondansetron and Treatment may vary due to age or medical condition  Airway Management Planned: Oral ETT  Additional Equipment: None  Intra-op Plan:   Post-operative Plan: Extubation in OR  Informed Consent: I have reviewed the patients History and Physical, chart, labs and discussed the procedure including the risks, benefits and alternatives for the proposed anesthesia with the patient or authorized representative who has indicated his/her understanding and acceptance.     Dental advisory given  Plan Discussed with: CRNA and Anesthesiologist  Anesthesia Plan Comments: (GA W L TAP block)       Anesthesia Quick Evaluation

## 2021-12-08 ENCOUNTER — Encounter (HOSPITAL_BASED_OUTPATIENT_CLINIC_OR_DEPARTMENT_OTHER)
Admission: RE | Disposition: A | Discharge status: Home / Self Care | Payer: Self-pay | Source: Home / Self Care | Attending: Surgery

## 2021-12-08 ENCOUNTER — Encounter (HOSPITAL_BASED_OUTPATIENT_CLINIC_OR_DEPARTMENT_OTHER): Payer: Self-pay | Admitting: Surgery

## 2021-12-08 ENCOUNTER — Ambulatory Visit (HOSPITAL_BASED_OUTPATIENT_CLINIC_OR_DEPARTMENT_OTHER)
Admission: RE | Admit: 2021-12-08 | Discharge: 2021-12-08 | Disposition: A | Discharge status: Home / Self Care | Payer: BC Managed Care – PPO | Attending: Surgery | Admitting: Surgery

## 2021-12-08 ENCOUNTER — Ambulatory Visit (HOSPITAL_BASED_OUTPATIENT_CLINIC_OR_DEPARTMENT_OTHER): Payer: BC Managed Care – PPO | Admitting: Anesthesiology

## 2021-12-08 ENCOUNTER — Other Ambulatory Visit: Payer: Self-pay

## 2021-12-08 DIAGNOSIS — K4090 Unilateral inguinal hernia, without obstruction or gangrene, not specified as recurrent: Secondary | ICD-10-CM | POA: Diagnosis not present

## 2021-12-08 DIAGNOSIS — G8918 Other acute postprocedural pain: Secondary | ICD-10-CM | POA: Diagnosis not present

## 2021-12-08 HISTORY — PX: INGUINAL HERNIA REPAIR: SHX194

## 2021-12-08 SURGERY — REPAIR, HERNIA, INGUINAL, ADULT
Anesthesia: General | Site: Groin | Laterality: Left

## 2021-12-08 MED ORDER — CLONIDINE HCL (ANALGESIA) 100 MCG/ML EP SOLN
EPIDURAL | Status: DC | PRN
Start: 2021-12-08 — End: 2021-12-08
  Administered 2021-12-08: 100 ug

## 2021-12-08 MED ORDER — FENTANYL CITRATE (PF) 100 MCG/2ML IJ SOLN
100.0000 ug | Freq: Once | INTRAMUSCULAR | Status: AC
  Administered 2021-12-08: 100 ug via INTRAVENOUS

## 2021-12-08 MED ORDER — LACTATED RINGERS IV SOLN: INTRAVENOUS | Status: DC

## 2021-12-08 MED ORDER — SUGAMMADEX SODIUM 200 MG/2ML IV SOLN
INTRAVENOUS | Status: DC | PRN
  Administered 2021-12-08: 200 mg via INTRAVENOUS

## 2021-12-08 MED ORDER — BUPIVACAINE HCL (PF) 0.5 % IJ SOLN
INTRAMUSCULAR | Status: AC
  Filled 2021-12-08: qty 30

## 2021-12-08 MED ORDER — ONDANSETRON HCL 4 MG/2ML IJ SOLN
INTRAMUSCULAR | Status: DC | PRN
Start: 2021-12-08 — End: 2021-12-08
  Administered 2021-12-08: 4 mg via INTRAVENOUS

## 2021-12-08 MED ORDER — CEFAZOLIN SODIUM-DEXTROSE 2-4 GM/100ML-% IV SOLN
2.0000 g | INTRAVENOUS | Status: AC
  Administered 2021-12-08: 2 g via INTRAVENOUS

## 2021-12-08 MED ORDER — FENTANYL CITRATE (PF) 100 MCG/2ML IJ SOLN
INTRAMUSCULAR | Status: AC
  Filled 2021-12-08: qty 2

## 2021-12-08 MED ORDER — PROPOFOL 10 MG/ML IV BOLUS
INTRAVENOUS | Status: DC | PRN
  Administered 2021-12-08: 200 mg via INTRAVENOUS

## 2021-12-08 MED ORDER — CHLORHEXIDINE GLUCONATE CLOTH 2 % EX PADS: 6.0000 | MEDICATED_PAD | Freq: Once | CUTANEOUS | Status: DC

## 2021-12-08 MED ORDER — OXYCODONE HCL 5 MG PO TABS
5.0000 mg | ORAL_TABLET | Freq: Once | ORAL | Status: AC | PRN
  Administered 2021-12-08: 5 mg via ORAL

## 2021-12-08 MED ORDER — TRAMADOL HCL 50 MG PO TABS: 50.0000 mg | ORAL_TABLET | Freq: Four times a day (QID) | ORAL | 0 refills | Status: DC | PRN

## 2021-12-08 MED ORDER — CEFAZOLIN SODIUM-DEXTROSE 2-4 GM/100ML-% IV SOLN
INTRAVENOUS | Status: AC
  Filled 2021-12-08: qty 100

## 2021-12-08 MED ORDER — OXYCODONE HCL 5 MG PO TABS
ORAL_TABLET | ORAL | Status: AC
  Filled 2021-12-08: qty 1

## 2021-12-08 MED ORDER — KETOROLAC TROMETHAMINE 30 MG/ML IJ SOLN
INTRAMUSCULAR | Status: AC
  Filled 2021-12-08: qty 1

## 2021-12-08 MED ORDER — HYDROMORPHONE HCL 1 MG/ML IJ SOLN: 0.2500 mg | INTRAMUSCULAR | Status: DC | PRN

## 2021-12-08 MED ORDER — FENTANYL CITRATE (PF) 100 MCG/2ML IJ SOLN
INTRAMUSCULAR | Status: DC | PRN
  Administered 2021-12-08: 50 ug via INTRAVENOUS
  Administered 2021-12-08 (×3): 25 ug via INTRAVENOUS

## 2021-12-08 MED ORDER — BUPIVACAINE LIPOSOME 1.3 % IJ SUSP: 20.0000 mL | Freq: Once | INTRAMUSCULAR | Status: DC

## 2021-12-08 MED ORDER — VANCOMYCIN HCL 1000 MG IV SOLR
INTRAVENOUS | Status: AC
  Filled 2021-12-08: qty 20

## 2021-12-08 MED ORDER — DEXAMETHASONE SODIUM PHOSPHATE 4 MG/ML IJ SOLN
INTRAMUSCULAR | Status: DC | PRN
  Administered 2021-12-08: 4 mg via INTRAVENOUS

## 2021-12-08 MED ORDER — MIDAZOLAM HCL 2 MG/2ML IJ SOLN
2.0000 mg | Freq: Once | INTRAMUSCULAR | Status: AC
  Administered 2021-12-08: 2 mg via INTRAVENOUS

## 2021-12-08 MED ORDER — PROPOFOL 500 MG/50ML IV EMUL
INTRAVENOUS | Status: AC
  Filled 2021-12-08: qty 100

## 2021-12-08 MED ORDER — OXYCODONE HCL 5 MG/5ML PO SOLN: 5.0000 mg | Freq: Once | ORAL | Status: AC | PRN

## 2021-12-08 MED ORDER — ONDANSETRON HCL 4 MG/2ML IJ SOLN: 4.0000 mg | Freq: Once | INTRAMUSCULAR | Status: DC | PRN

## 2021-12-08 MED ORDER — ROCURONIUM BROMIDE 100 MG/10ML IV SOLN
INTRAVENOUS | Status: DC | PRN
  Administered 2021-12-08: 70 mg via INTRAVENOUS

## 2021-12-08 MED ORDER — ROPIVACAINE HCL 5 MG/ML IJ SOLN
INTRAMUSCULAR | Status: DC | PRN
  Administered 2021-12-08: 30 mL

## 2021-12-08 MED ORDER — KETOROLAC TROMETHAMINE 30 MG/ML IJ SOLN
30.0000 mg | Freq: Once | INTRAMUSCULAR | Status: AC | PRN
  Administered 2021-12-08: 30 mg via INTRAVENOUS

## 2021-12-08 MED ORDER — MIDAZOLAM HCL 2 MG/2ML IJ SOLN
INTRAMUSCULAR | Status: AC
  Filled 2021-12-08: qty 2

## 2021-12-08 MED ORDER — EPINEPHRINE PF 1 MG/ML IJ SOLN
INTRAMUSCULAR | Status: AC
  Filled 2021-12-08: qty 8

## 2021-12-08 SURGICAL SUPPLY — 43 items
ADH SKN CLS APL DERMABOND .7 (GAUZE/BANDAGES/DRESSINGS) ×1
APL PRP STRL LF DISP 70% ISPRP (MISCELLANEOUS) ×1
BLADE CLIPPER SURG (BLADE) ×3 IMPLANT
BLADE SURG 15 STRL LF DISP TIS (BLADE) ×2 IMPLANT
BLADE SURG 15 STRL SS (BLADE) ×2
CANISTER SUCT 1200ML W/VALVE (MISCELLANEOUS) IMPLANT
CHLORAPREP W/TINT 26 (MISCELLANEOUS) ×3 IMPLANT
CLEANER CAUTERY TIP 5X5 PAD (MISCELLANEOUS) ×2 IMPLANT
COVER BACK TABLE 60X90IN (DRAPES) ×3 IMPLANT
COVER MAYO STAND STRL (DRAPES) ×3 IMPLANT
DECANTER SPIKE VIAL GLASS SM (MISCELLANEOUS) IMPLANT
DERMABOND ADVANCED (GAUZE/BANDAGES/DRESSINGS) ×1
DERMABOND ADVANCED .7 DNX12 (GAUZE/BANDAGES/DRESSINGS) ×2 IMPLANT
DRAIN PENROSE .5X12 LATEX STL (DRAIN) ×3 IMPLANT
DRAPE LAPAROTOMY 100X72 PEDS (DRAPES) ×3 IMPLANT
DRAPE UTILITY XL STRL (DRAPES) ×3 IMPLANT
ELECT REM PT RETURN 9FT ADLT (ELECTROSURGICAL) ×2
ELECTRODE REM PT RTRN 9FT ADLT (ELECTROSURGICAL) ×2 IMPLANT
GAUZE SPONGE 4X4 12PLY STRL LF (GAUZE/BANDAGES/DRESSINGS) IMPLANT
GLOVE SURG ORTHO LTX SZ8 (GLOVE) ×3 IMPLANT
GOWN STRL REUS W/ TWL LRG LVL3 (GOWN DISPOSABLE) ×2 IMPLANT
GOWN STRL REUS W/ TWL XL LVL3 (GOWN DISPOSABLE) ×2 IMPLANT
GOWN STRL REUS W/TWL LRG LVL3 (GOWN DISPOSABLE) ×2
GOWN STRL REUS W/TWL XL LVL3 (GOWN DISPOSABLE) ×2
MESH ULTRAPRO 3X6 7.6X15CM (Mesh General) ×1 IMPLANT
NDL HYPO 25X1 1.5 SAFETY (NEEDLE) ×2 IMPLANT
NEEDLE HYPO 25X1 1.5 SAFETY (NEEDLE) ×2 IMPLANT
NS IRRIG 1000ML POUR BTL (IV SOLUTION) ×3 IMPLANT
PACK BASIN DAY SURGERY FS (CUSTOM PROCEDURE TRAY) ×3 IMPLANT
PAD CLEANER CAUTERY TIP 5X5 (MISCELLANEOUS) ×1
PENCIL SMOKE EVACUATOR (MISCELLANEOUS) ×3 IMPLANT
SLEEVE SCD COMPRESS KNEE MED (STOCKING) ×1 IMPLANT
SUT MNCRL AB 4-0 PS2 18 (SUTURE) ×3 IMPLANT
SUT NOVA 0 T19/GS 22DT (SUTURE) ×1 IMPLANT
SUT NOVA NAB GS-22 2 0 T19 (SUTURE) ×6 IMPLANT
SUT SILK 2 0 SH (SUTURE) ×3 IMPLANT
SUT SILK 2 0 TIES 17X18 (SUTURE)
SUT SILK 2-0 18XBRD TIE BLK (SUTURE) IMPLANT
SUT VICRYL 3-0 CR8 SH (SUTURE) ×3 IMPLANT
SYR CONTROL 10ML LL (SYRINGE) ×3 IMPLANT
TOWEL GREEN STERILE FF (TOWEL DISPOSABLE) ×6 IMPLANT
TUBE CONNECTING 20X1/4 (TUBING) IMPLANT
YANKAUER SUCT BULB TIP NO VENT (SUCTIONS) IMPLANT

## 2021-12-08 NOTE — Interval H&P Note (Signed)
History and Physical Interval Note:  12/08/2021 7:11 AM  Billy Haynes  has presented today for surgery, with the diagnosis of left inguinal hernia.  The various methods of treatment have been discussed with the patient and family. After consideration of risks, benefits and other options for treatment, the patient has consented to    Procedure(s): open repair left inguinal hernia repair with mesh (Left) as a surgical intervention.    The patient's history has been reviewed, patient examined, no change in status, stable for surgery.  I have reviewed the patient's chart and labs.  Questions were answered to the patient's satisfaction.    Armandina Gemma, [facility] [place] practice Office: [place]

## 2021-12-08 NOTE — Transfer of Care (Signed)
Immediate Anesthesia Transfer of Care Note  Patient: Billy Haynes  Procedure(s) Performed: open repair left inguinal hernia repair with mesh (Left: Groin)  Patient Location: PACU  Anesthesia Type:GA combined with regional for post-op pain  Level of Consciousness: awake, alert , oriented and patient cooperative  Airway & Oxygen Therapy: Patient Spontanous Breathing and Patient connected to face mask oxygen  Post-op Assessment: Report given to RN and Post -op Vital signs reviewed and stable  Post vital signs: Reviewed and stable  Last Vitals:  Vitals Value Taken Time  BP    Temp    Pulse    Resp    SpO2      Last Pain:  Vitals:   12/08/21 0638  TempSrc: Oral  PainSc: 0-No pain         Complications: No notable events documented.

## 2021-12-08 NOTE — Anesthesia Procedure Notes (Addendum)
Anesthesia Regional Block: TAP block   Pre-Anesthetic Checklist: , timeout performed,  Correct Patient, Correct Site, Correct Laterality,  Correct Procedure, Correct Position, site marked,  Risks and benefits discussed  Laterality: Left and Upper  Prep: Maximum Sterile Barrier Precautions used, chloraprep       Needles:  Injection technique: Single-shot  Needle Type: Echogenic Needle     Needle Length: 9cm  Needle Gauge: 21     Additional Needles:   Narrative:  Start time: 12/08/2021 7:08 AM End time: 12/08/2021 7:15 AM  Performed by: Personally  Anesthesiologist: Barnet Glasgow, MD

## 2021-12-08 NOTE — Anesthesia Postprocedure Evaluation (Signed)
Anesthesia Post Note  Patient: Billy Haynes  Procedure(s) Performed: open repair left inguinal hernia repair with mesh (Left: Groin)     Patient location during evaluation: PACU Anesthesia Type: General and Regional Level of consciousness: awake and alert Pain management: pain level controlled Vital Signs Assessment: post-procedure vital signs reviewed and stable Respiratory status: spontaneous breathing, nonlabored ventilation, respiratory function stable and patient connected to nasal cannula oxygen Cardiovascular status: blood pressure returned to baseline and stable Postop Assessment: no apparent nausea or vomiting Anesthetic complications: no   No notable events documented.  Last Vitals:  Vitals:   12/08/21 0930 12/08/21 1003  BP: (!) 142/96 (!) 151/95  Pulse: 70 71  Resp: 16 20  Temp:  36.7 C  SpO2: 95% 95%    Last Pain:  Vitals:   12/08/21 1003  TempSrc: Oral  PainSc: [place]

## 2021-12-08 NOTE — Op Note (Signed)
Procedure Note  Pre-operative Diagnosis:  Left inguinal hernia, reducible  Post-operative Diagnosis: same  Procedure:  Open left inguinal hernia repair with mesh  Surgeon:  Armandina Gemma, MD  Assistant: Carlena Hurl, PA-C  Anesthesia:  General  Preparation:  Chlora-prep  Estimated Blood Loss: minimal  Complications:  none  Indications: The patient presented with a left, reducible hernia.  Patient is referred by Karl Ito for surgical evaluation and management of newly diagnosed left inguinal hernia. Patient first noted discomfort and a bulge in the left groin in September 2022. He was evaluated by his primary care provider and diagnosed with left inguinal hernia. Patient underwent an ultrasound which confirmed the finding of left inguinal hernia. Patient has had no prior abdominal surgery. He has had no prior hernia repairs. He has noted some intermittent episodes of constipation recently for which she has taken Dulcolax with relief. Hernia has always been reducible when the patient lies recumbent. He has had no other signs or symptoms of obstruction. Patient presents today accompanied by his wife to discuss hernia repair. Patient's cousin is a Immunologist at [employer] [facility].  Procedure Details  The patient was evaluated in the holding area. All of the patient's questions were answered and the proposed procedure was confirmed. The site of the procedure was properly marked. The patient was taken to the Operating Room, identified by name, and the procedure verified as inguinal hernia repair.  The patient was placed in the supine position and underwent induction of anesthesia. A "Time Out" was performed per routine. The lower abdomen and groin were prepped and draped in the usual aseptic fashion.  After ascertaining that an adequate level of anesthesia had been obtained, an incision was made in the groin with a #10 blade.  Dissection was carried through the subcutaneous tissues and hemostasis obtained  with the electrocautery.  A Gelpi retractor was placed for exposure.  The external oblique fascia was incised in line with it's fibers and extended through the external inguinal ring.  The cord structures were dissected out of the inguinal canal and encircled with a Penrose drain.  The floor of the inguinal canal was dissected out.  There is some laxity of the floor without a true defect.  The cord was explored and a moderate sized lipoma is excised.  There is a moderate sized hernia sac containing sigmoid colon.  The colon is freed from the sac and reduced.  A pursestring suture is placed around the neck of the sac and tie securely.  The sac is excised and discarded.  The lateral internal ring is closed with an interrupted 0-Novofil suture.  The floor of the inguinal canal was reconstructed with Ethicon Ultrapro mesh cut to the appropriate dimensions.  It was secured to the pubic tubercle with a 2-0 Novafil suture and along the inguinal ligament with a running 2-0 Novafil suture.  Mesh was split to accommodate the cord structures.  The superior margin of the mesh was secured to the transversalis and internal oblique musculature with interrupted 2-0 Novafil sutures.  The tails of the mesh were overlapped lateral to the cord structures and secured to the inguinal ligament with interrupted 2-0 Novafil sutures to recreate the internal inguinal ring.  Cord structures were returned to the inguinal canal.  Local anesthetic was infiltrated throughout the field.  External oblique fascia was closed with interrupted 3-0 Vicryl sutures.  Subcutaneous tissues were closed with interrupted 3-0 Vicryl sutures.  Skin was anesthetized with local anesthetic, and the skin edges  were re-approximated with a running 4-0 Monocryl suture.  Wound was washed and dried and Dermabond was applied.  Instrument, sponge, and needle counts were correct prior to closure and at the conclusion of the case.  The patient tolerated the procedure  well.  The patient was awakened from anesthesia and brought to the recovery room in stable condition.  Armandina Gemma, MD [facility] Office: [phone]

## 2021-12-08 NOTE — Discharge Instructions (Addendum)
[facility]  HERNIA REPAIR POST OP INSTRUCTIONS  Always review your discharge instruction sheet given to you by the facility where your surgery was performed.  A  prescription for pain medication may be sent to your pharmacy on discharge.  Take your pain medication as prescribed.  If narcotic pain medicine is not needed, then you may take acetaminophen (Tylenol) or ibuprofen (Advil) as needed.  Take your usually prescribed medications unless otherwise directed.  If you need a refill on your pain medication, please contact your pharmacy.  They will contact our office to request authorization. Prescriptions will not be filled after 5:00 PM daily or on weekends.  You should follow a light diet the first 24 hours after arrival home, such as soup and crackers or toast.  Be sure to include plenty of fluids daily.  Resume your normal diet the day after surgery.  Most patients will experience some swelling and bruising around the surgical site.  Ice packs and reclining will help.  Swelling and bruising can take several days to resolve.   It is common to experience some constipation if taking pain medication after surgery.  Increasing fluid intake and taking a stool softener (such as Colace) will usually help or prevent this problem from occurring.  A mild laxative (Milk of Magnesia or Miralax) should be taken according to package directions if there is no bowel movement after 48 hours.  You will likely have Dermabond (topical glue) over your incisions.  This seals the incisions and allows you to bathe and shower at any time after your surgery.  Glue should remain in place for up to 10 days.  It may be removed after 10 days by pealing off the Dermabond material or using Vaseline or naval jelly to remove.  ACTIVITIES:  You may resume regular (light) daily activities beginning the next day - such as daily self-care, walking, climbing stairs - gradually increasing activities as tolerated.  You  may have sexual intercourse when it is comfortable.  Refrain from any heavy lifting or straining until approved by your doctor.  You may drive when you are no longer taking prescription pain medication, when you can comfortably wear a seatbelt, and when you can safely maneuver your car and apply the brakes.  You should see your doctor in the office for a follow-up appointment approximately 2-3 weeks after your surgery.  Make sure that you call for this appointment within a day or two after you arrive home to insure a convenient appointment time.  WHEN TO CALL YOUR DOCTOR: Fever greater than 101.0 Inability to urinate Persistent nausea and/or vomiting Extreme swelling or bruising Continued bleeding from incision Increased pain, redness, or drainage from the incision  The clinic staff is available to answer your questions during regular business hours.  Please dont hesitate to call and ask to speak to one of the nurses for clinical concerns.  If you have a medical emergency, go to the nearest emergency room or call 911.  A surgeon from [facility] is always on call for the hospital.   [facility] [address], [place], [place], [place]  [address]  [phone] ? [phone] ? FAX [phone]       Post Anesthesia Home Care Instructions  Activity: Get plenty of rest for the remainder of the day. A responsible individual must stay with you for 24 hours following the procedure.  For the next 24 hours, DO NOT: -Drive a car Film/video editor -Drink  alcoholic beverages -Take any medication unless instructed by your physician -Make any legal decisions or sign important papers.  Meals: Start with liquid foods such as gelatin or soup. Progress to regular foods as tolerated. Avoid greasy, spicy, heavy foods. If nausea and/or vomiting occur, drink only clear liquids until the nausea and/or vomiting subsides. Call your physician if vomiting  continues.  Special Instructions/Symptoms: Your throat may feel dry or sore from the anesthesia or the breathing tube placed in your throat during surgery. If this causes discomfort, gargle with warm salt water. The discomfort should disappear within 24 hours.  If you had a scopolamine patch placed behind your ear for the management of post- operative nausea and/or vomiting:  1. The medication in the patch is effective for 72 hours, after which it should be removed.  Wrap patch in a tissue and discard in the trash. Wash hands thoroughly with soap and water. 2. You may remove the patch earlier than 72 hours if you experience unpleasant side effects which may include dry mouth, dizziness or visual disturbances. 3. Avoid touching the patch. Wash your hands with soap and water after contact with the patch.

## 2021-12-08 NOTE — Anesthesia Procedure Notes (Addendum)
Procedure Name: Intubation Date/Time: 12/08/2021 7:36 AM Performed by: Signe Colt, CRNA Pre-anesthesia Checklist: Patient identified, Emergency Drugs available, Suction available and Patient being monitored Patient Re-evaluated:Patient Re-evaluated prior to induction Oxygen Delivery Method: Circle system utilized Preoxygenation: Pre-oxygenation with 100% oxygen Induction Type: IV induction Ventilation: Mask ventilation without difficulty Laryngoscope Size: Mac and 3 Grade View: Grade II Tube type: Oral Tube size: 7.0 mm Number of attempts: 1 Airway Equipment and Method: Stylet and Oral airway Placement Confirmation: ETT inserted through vocal cords under direct vision, positive ETCO2 and breath sounds checked- equal and bilateral Secured at: 21 cm Tube secured with: Tape Dental Injury: Teeth and Oropharynx as per pre-operative assessment

## 2021-12-08 NOTE — Progress Notes (Signed)
Assisted Dr. Valma Cava with left, ultrasound guided, transabdominal plane block. Side rails up, monitors on throughout procedure. See vital signs in flow sheet. Tolerated Procedure well.

## 2021-12-09 ENCOUNTER — Encounter (HOSPITAL_BASED_OUTPATIENT_CLINIC_OR_DEPARTMENT_OTHER): Payer: Self-pay | Admitting: Surgery

## 2021-12-09 NOTE — Addendum Note (Signed)
Addendum  created 12/09/21 1348 by Barnet Glasgow, MD   Clinical Note Signed, Intraprocedure Blocks edited, SmartForm saved

## 2021-12-09 NOTE — Addendum Note (Signed)
Addendum  created 12/09/21 1346 by Barnet Glasgow, MD   Clinical Note Signed, Intraprocedure Blocks edited

## 2022-03-02 ENCOUNTER — Encounter: Payer: Self-pay | Admitting: Family Medicine

## 2022-03-02 ENCOUNTER — Ambulatory Visit (INDEPENDENT_AMBULATORY_CARE_PROVIDER_SITE_OTHER): Payer: BC Managed Care – PPO | Admitting: Family Medicine

## 2022-03-02 VITALS — BP 148/80 | HR 61 | Temp 97.6°F | Ht 71.0 in | Wt 236.8 lb

## 2022-03-02 DIAGNOSIS — R109 Unspecified abdominal pain: Secondary | ICD-10-CM

## 2022-03-02 DIAGNOSIS — R350 Frequency of micturition: Secondary | ICD-10-CM | POA: Diagnosis not present

## 2022-03-02 DIAGNOSIS — E782 Mixed hyperlipidemia: Secondary | ICD-10-CM | POA: Diagnosis not present

## 2022-03-02 DIAGNOSIS — M545 Low back pain, unspecified: Secondary | ICD-10-CM

## 2022-03-02 DIAGNOSIS — E785 Hyperlipidemia, unspecified: Secondary | ICD-10-CM | POA: Insufficient documentation

## 2022-03-02 DIAGNOSIS — I10 Essential (primary) hypertension: Secondary | ICD-10-CM

## 2022-03-02 LAB — POCT URINALYSIS DIP (CLINITEK)
Bilirubin, UA: NEGATIVE
Blood, UA: NEGATIVE
Glucose, UA: NEGATIVE mg/dL
Ketones, POC UA: NEGATIVE mg/dL
Nitrite, UA: NEGATIVE
Spec Grav, UA: 1.015 (ref 1.010–1.025)
Urobilinogen, UA: 2 E.U./dL — AB
pH, UA: 7 (ref 5.0–8.0)

## 2022-03-02 MED ORDER — ROSUVASTATIN CALCIUM 10 MG PO TABS: 10.0000 mg | ORAL_TABLET | Freq: Every day | ORAL | 3 refills | Status: DC

## 2022-03-02 MED ORDER — CYCLOBENZAPRINE HCL 5 MG PO TABS: 5.0000 mg | ORAL_TABLET | Freq: Three times a day (TID) | ORAL | 0 refills | Status: DC | PRN

## 2022-03-02 NOTE — Assessment & Plan Note (Signed)
The 10-year ASCVD risk score (Arnett DK, et al., 2019) is: 15.1% ?  Values used to calculate the score: ?    Age: 61 years ?    Sex: Male ?    Is Non-Hispanic African American: No ?    Diabetic: No ?    Tobacco smoker: No ?    Systolic Blood Pressure: 132 mmHg ?    Is BP treated: No ?    HDL Cholesterol: 45 mg/dL ?    Total Cholesterol: 284 mg/dL ?At risk and would benefit from statin. Has not started, will start crestor. F/u in 3 months with pcp for labs and bp check.  ?

## 2022-03-02 NOTE — Assessment & Plan Note (Signed)
May be pain/NSAID related. Advised home monitoring and f/u with pcp in 3 months ?

## 2022-03-02 NOTE — Assessment & Plan Note (Signed)
Hx of kidney stones, but no blood in urine. Urine with LE but no symptoms consistent with UTI. Will check renal function for reassurance. Advised continued NSAID/Tylenol and flexeril sent. If no improvement can get CT to rule out stone due to location and hx.  ?

## 2022-03-02 NOTE — Patient Instructions (Addendum)
Likely not a urine infection without symptoms ? ?May still be a kidney stone - but no blood in urine is reassuring ? ?Call in 2 days to consider imaging for stone ? ?Start crestor for cholesterol ? ?Your blood pressure high.  ? ?High blood pressure increases your risk for heart attack and stroke.  ? ? ?Please check your blood pressure 2-4 times a week.  ? ?To check your blood pressure ?1) Sit in a quiet and relaxed place for 5 minutes ?2) Make sure your feet are flat on the ground ?3) Consider checking first thing in the morning  ? ?Normal blood pressure is less than 140/90 ?Ideally you blood pressure should be around 120/80 ? ?Other ways you can reduce your blood pressure:  ?1) Regular exercise ?-- Try to get 150 minutes (30 minutes, 5 days a week) of moderate to vigorous aerobic excercise ?-- Examples: brisk walking (2.5 miles per hour), water aerobics, dancing, gardening, tennis, biking slower than 10 miles per hour ?2) DASH Diet - low fat meats, more fresh fruits and vegetables, whole grains, low salt ?3) Quit smoking if you smoke ?4) Loose 5-10% of your body weight ? ? ? ?

## 2022-03-02 NOTE — Progress Notes (Signed)
? ?Subjective:  ? ?  ?Billy Haynes is a 61 y.o. male presenting for Urinary Tract Infection (Started about 6 day  ago,  feels like a kidney  or uti , urine  frequent rlq pain, taking otc pain medication , no fever , no chills, no blood in urine  ) ?  ? ?#Back pain ?- started 6 days ?- right flank ?- similar to when he had a kidney stone in the past ?- initially thought muscle - used a TENS unit and massage w/o relief ?- treatment: 400 mg tylenol and ibuprofen w/ improvement ?- pain is constant  ?- cannot lay back - had to sleep in recliner last 4 nights ?- no shooting pain down the leg ?- spasm and stabbing pain ?- worse with movement - occasionally "grabbing" pain ?- can feel pain inside  ?- worse with laying back  ? ? ? ? ?Social History  ? ?Tobacco Use  ?Smoking Status Never  ?Smokeless Tobacco Never  ? ?Review of Systems  ?Constitutional:  Negative for chills and fever.  ?Gastrointestinal:  Negative for constipation, diarrhea, nausea and vomiting.  ?Genitourinary:  Positive for flank pain. Negative for dysuria, frequency, hematuria and urgency.  ?Musculoskeletal:  Positive for back pain.  ? ? ?   ?Objective:  ?  ?BP Readings from Last 3 Encounters:  ?03/02/22 (!) 148/80  ?12/08/21 (!) 151/95  ?08/19/21 130/90  ? ?Wt Readings from Last 3 Encounters:  ?03/02/22 236 lb 12.8 oz (107.4 kg)  ?12/08/21 229 lb 15 oz (104.3 kg)  ?08/19/21 229 lb 1 oz (103.9 kg)  ? ? ?BP (!) 148/80   Pulse 61   Temp 97.6 ?F (36.4 ?C)   Ht '5\' 11"'$  (1.803 m)   Wt 236 lb 12.8 oz (107.4 kg)   SpO2 98%   BMI 33.03 kg/m?  ? ? ?Physical Exam ?Constitutional:   ?   Appearance: Normal appearance. He is not ill-appearing or diaphoretic.  ?HENT:  ?   Right Ear: External ear normal.  ?   Left Ear: External ear normal.  ?Eyes:  ?   General: No scleral icterus. ?   Extraocular Movements: Extraocular movements intact.  ?   Conjunctiva/sclera: Conjunctivae normal.  ?Cardiovascular:  ?   Rate and Rhythm: Normal rate.  ?Pulmonary:  ?   Effort:  Pulmonary effort is normal.  ?Abdominal:  ?   General: Abdomen is flat. Bowel sounds are normal.  ?   Palpations: Abdomen is soft.  ?   Tenderness: There is no abdominal tenderness. There is no right CVA tenderness, left CVA tenderness, guarding or rebound.  ?Musculoskeletal:  ?   Cervical back: Neck supple.  ?   Comments: Back ?Inspection: no abnormalities ?Palpation: ttp along the right lower thoracic/upper lumbar paraspinous, no spinous ttp ?ROM: normal but pain with lateral flexion ?Strength: normal ?Reflexes normal  ?Skin: ?   General: Skin is warm and dry.  ?Neurological:  ?   Mental Status: He is alert. Mental status is at baseline.  ?Psychiatric:     ?   Mood and Affect: Mood normal.  ? ? ?UA: +LE and protein, no blood ? ? ?   ?Assessment & Plan:  ? ?Problem List Items Addressed This Visit   ? ?  ? Cardiovascular and Mediastinum  ? Essential hypertension  ?  May be pain/NSAID related. Advised home monitoring and f/u with pcp in 3 months ?  ?  ? Relevant Medications  ? rosuvastatin (CRESTOR) 10 MG tablet  ?  ?  Other  ? Mixed hyperlipidemia  ?  The 10-year ASCVD risk score (Arnett DK, et al., 2019) is: 15.1% ?  Values used to calculate the score: ?    Age: 37 years ?    Sex: Male ?    Is Non-Hispanic African American: No ?    Diabetic: No ?    Tobacco smoker: No ?    Systolic Blood Pressure: 465 mmHg ?    Is BP treated: No ?    HDL Cholesterol: 45 mg/dL ?    Total Cholesterol: 284 mg/dL ?At risk and would benefit from statin. Has not started, will start crestor. F/u in 3 months with pcp for labs and bp check.  ?  ?  ? Relevant Medications  ? rosuvastatin (CRESTOR) 10 MG tablet  ? Acute right-sided low back pain without sciatica - Primary  ?  Hx of kidney stones, but no blood in urine. Urine with LE but no symptoms consistent with UTI. Will check renal function for reassurance. Advised continued NSAID/Tylenol and flexeril sent. If no improvement can get CT to rule out stone due to location and hx.  ?  ?  ?  Relevant Medications  ? cyclobenzaprine (FLEXERIL) 5 MG tablet  ? ?Other Visit Diagnoses   ? ? Urine frequency      ? Relevant Orders  ? POCT URINALYSIS DIP (CLINITEK) (Completed)  ? Flank pain      ? Relevant Orders  ? Basic metabolic panel  ? ?  ? ? ? ?Return in about 3 months (around 06/01/2022) for blood pressure and cholesterol - come for fasting labs that day. ? ?Lesleigh Noe, MD ? ? ? ?

## 2022-03-03 LAB — BASIC METABOLIC PANEL
BUN/Creatinine Ratio: 18 (ref 9–20)
BUN: 17 mg/dL (ref 6–24)
CO2: 23 mmol/L (ref 20–29)
Calcium: 8.9 mg/dL (ref 8.7–10.2)
Chloride: 108 mmol/L — ABNORMAL HIGH (ref 96–106)
Creatinine, Ser: 0.92 mg/dL (ref 0.76–1.27)
Glucose: 99 mg/dL (ref 70–99)
Potassium: 4.2 mmol/L (ref 3.5–5.2)
Sodium: 145 mmol/L — ABNORMAL HIGH (ref 134–144)
eGFR: 96 mL/min/{1.73_m2} (ref >59)

## 2022-11-23 HISTORY — PX: TOTAL SHOULDER REPLACEMENT: SUR1217

## 2022-12-03 DIAGNOSIS — L821 Other seborrheic keratosis: Secondary | ICD-10-CM | POA: Diagnosis not present

## 2022-12-03 DIAGNOSIS — D225 Melanocytic nevi of trunk: Secondary | ICD-10-CM | POA: Diagnosis not present

## 2022-12-03 DIAGNOSIS — D2271 Melanocytic nevi of right lower limb, including hip: Secondary | ICD-10-CM | POA: Diagnosis not present

## 2022-12-03 DIAGNOSIS — L812 Freckles: Secondary | ICD-10-CM | POA: Diagnosis not present

## 2023-06-25 DIAGNOSIS — M25512 Pain in left shoulder: Secondary | ICD-10-CM | POA: Diagnosis not present

## 2023-07-05 DIAGNOSIS — M75112 Incomplete rotator cuff tear or rupture of left shoulder, not specified as traumatic: Secondary | ICD-10-CM | POA: Diagnosis not present

## 2023-07-05 DIAGNOSIS — S46812A Strain of other muscles, fascia and tendons at shoulder and upper arm level, left arm, initial encounter: Secondary | ICD-10-CM | POA: Diagnosis not present

## 2023-07-05 DIAGNOSIS — M7582 Other shoulder lesions, left shoulder: Secondary | ICD-10-CM | POA: Diagnosis not present

## 2023-07-05 DIAGNOSIS — M25512 Pain in left shoulder: Secondary | ICD-10-CM | POA: Diagnosis not present

## 2023-07-05 DIAGNOSIS — M25412 Effusion, left shoulder: Secondary | ICD-10-CM | POA: Diagnosis not present

## 2023-07-13 ENCOUNTER — Telehealth: Payer: Self-pay | Admitting: Nurse Practitioner

## 2023-07-13 NOTE — Telephone Encounter (Signed)
Patient last seen in 2022- spoke with patient to schedule annual physical, said he will call the office back when he is off work

## 2023-07-15 DIAGNOSIS — M25512 Pain in left shoulder: Secondary | ICD-10-CM | POA: Diagnosis not present

## 2023-07-16 ENCOUNTER — Other Ambulatory Visit: Payer: Self-pay | Admitting: Orthopaedic Surgery

## 2023-07-16 DIAGNOSIS — Z01818 Encounter for other preprocedural examination: Secondary | ICD-10-CM

## 2023-07-17 IMAGING — US US PELVIS LIMITED
1 series · 9 of 9 positions shown · non-contrast
Comparison: None.

CLINICAL DATA: Evaluate for possible left-sided inguinal hernia.

EXAM:
LIMITED ULTRASOUND OF PELVIS
TECHNIQUE: Limited transabdominal ultrasound examination of the pelvis was
performed.

[Series 1: us pelvis limited · 9 acquisitions, 9 frames shown]
[im 1/9]
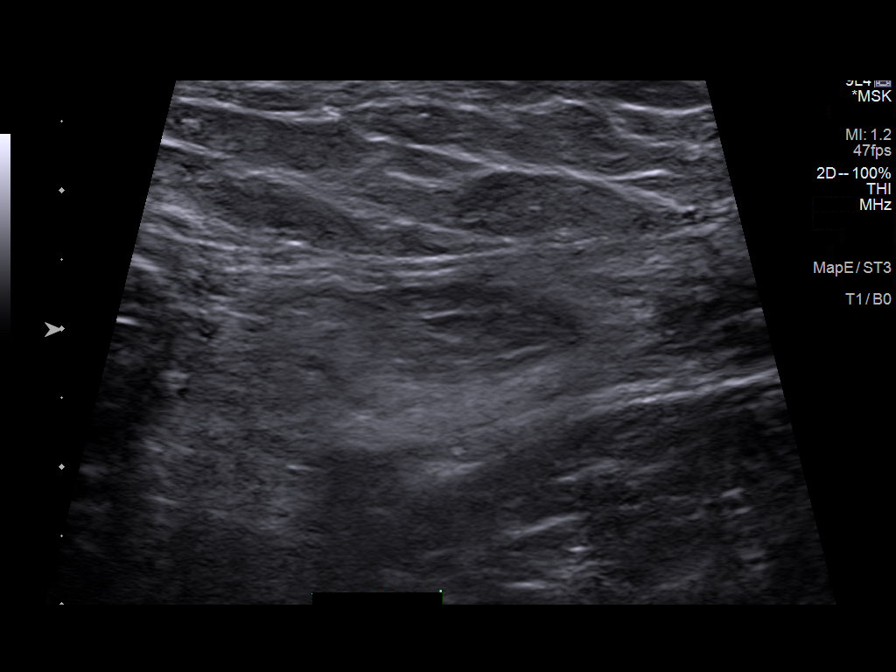
[im 2/9]
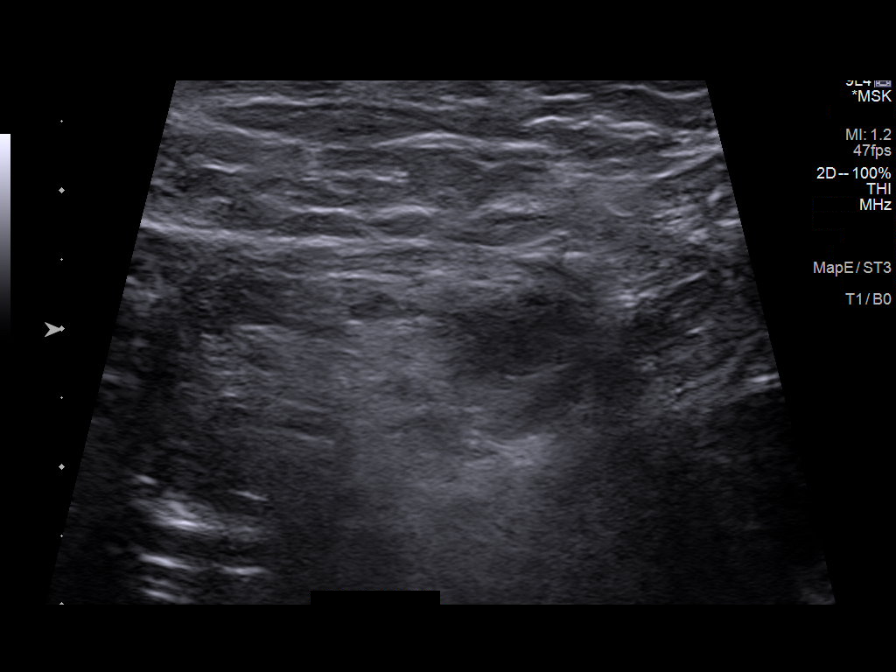
[im 3/9]
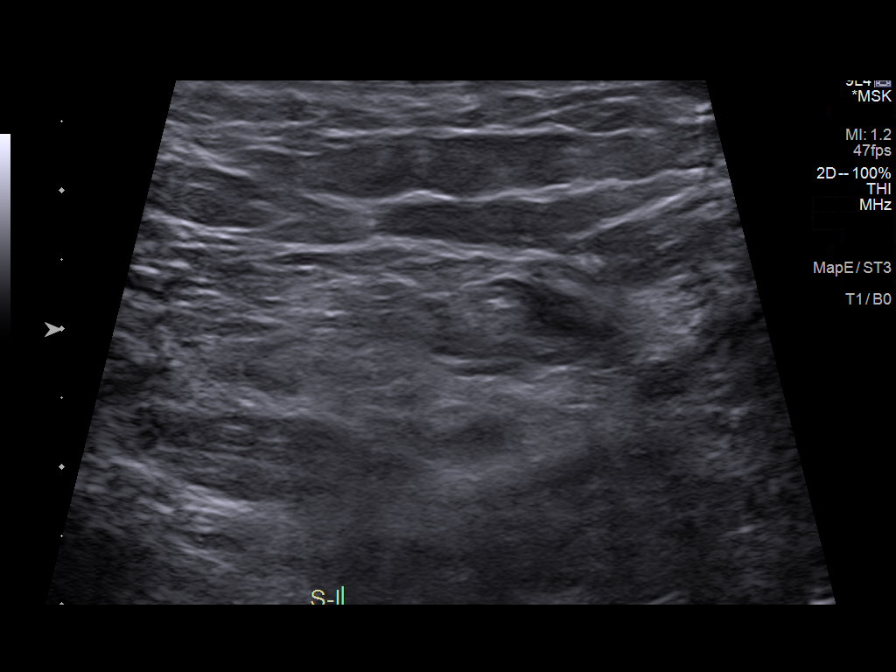
[im 4/9]
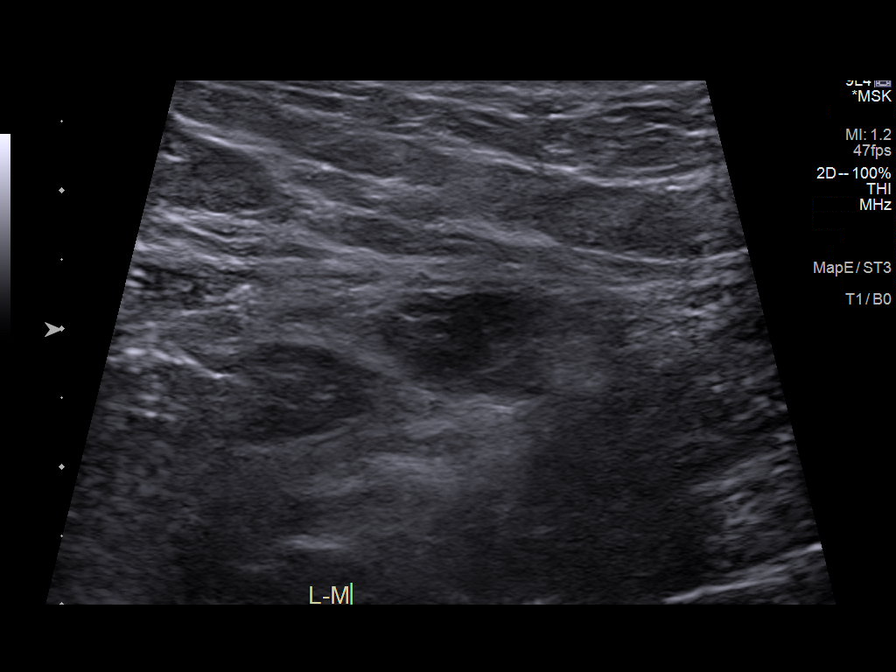
[im 5/9]
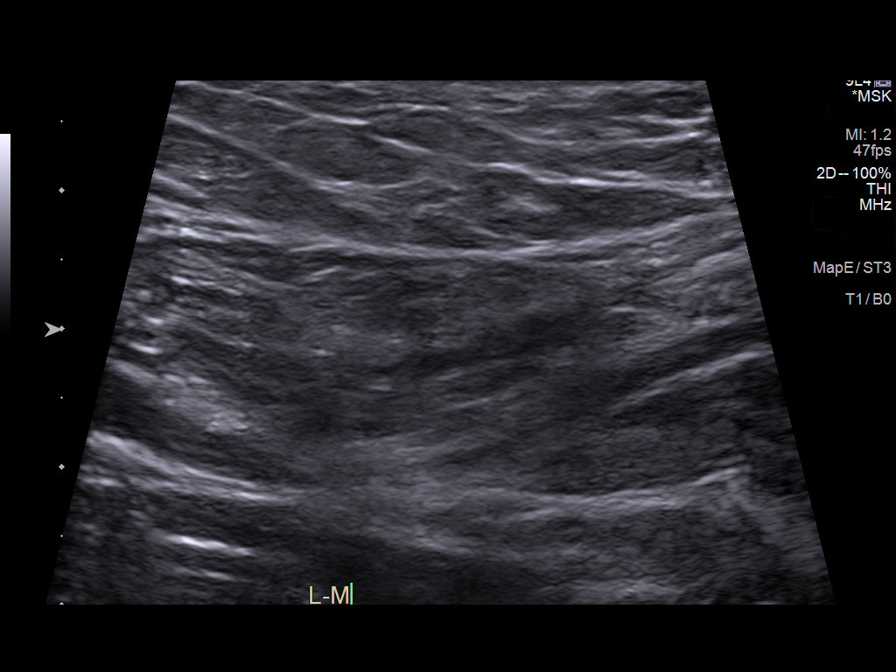
[im 6/9]
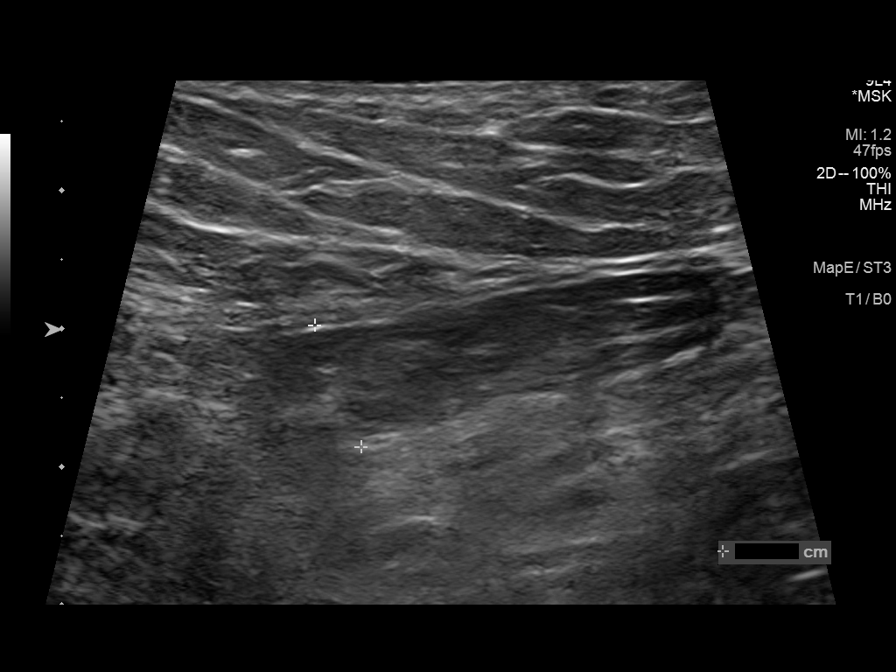
[im 7/9]
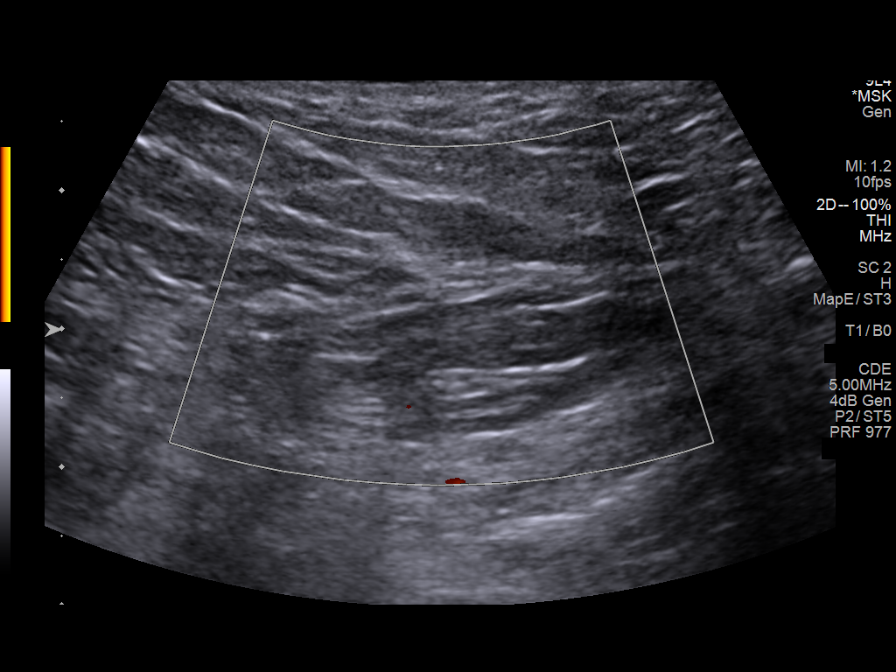
[im 8/9]
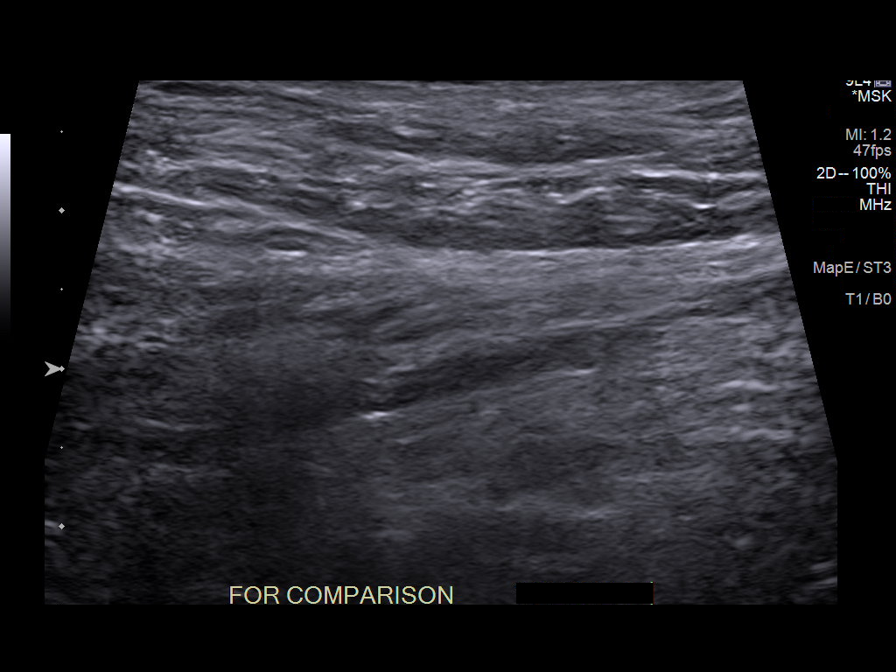
[im 9/9]
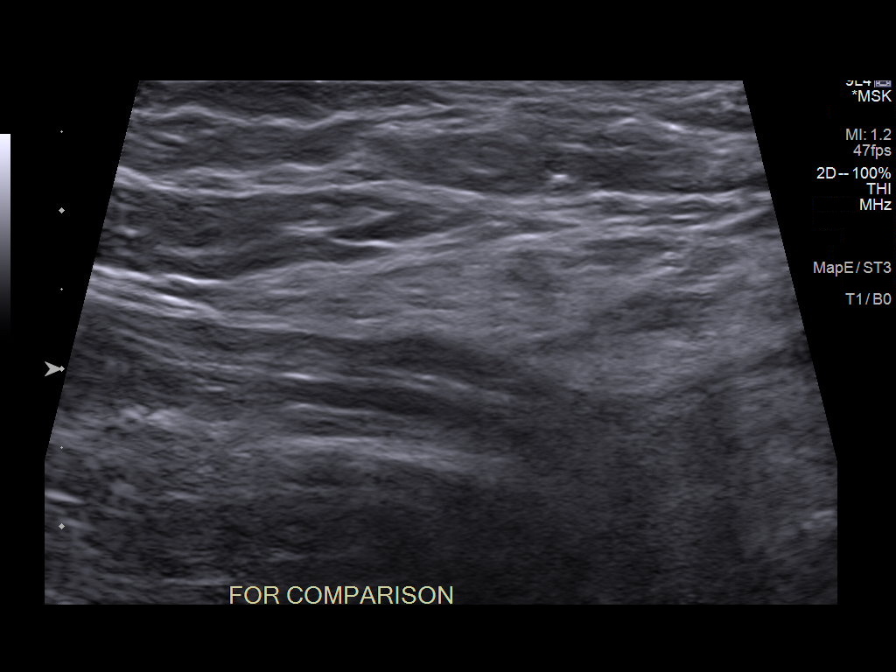

[9 of 9 positions shown; findings below may reference images not displayed]

FINDINGS: Ultrasound examination over the clinical area of concern over the
left inguinal region demonstrates no evidence of focal mass or fluid
collection. There is evidence suggesting a hernia in this region. It
is difficult to assess contents of the hernia sac which appears to
contain mostly peritoneal fat.
IMPRESSION: Findings suggesting a left inguinal hernia. Pelvic CT may be helpful
for further characterization.

## 2023-07-22 ENCOUNTER — Telehealth: Payer: Self-pay | Admitting: Nurse Practitioner

## 2023-07-22 NOTE — Telephone Encounter (Signed)
Please schedule pre op clearance office visit for this pt.

## 2023-07-22 NOTE — Telephone Encounter (Signed)
Spoke to pt, scheduled ov for 9/4

## 2023-07-22 NOTE — Telephone Encounter (Signed)
Got a pre-op clearance  from [employer]. Patient needs to be scheduled for an office visit for preoperative clearance

## 2023-07-23 ENCOUNTER — Ambulatory Visit
Admission: RE | Admit: 2023-07-23 | Discharge: 2023-07-23 | Disposition: A | Discharge status: Home / Self Care | Payer: BC Managed Care – PPO | Source: Ambulatory Visit | Attending: Orthopaedic Surgery | Admitting: Orthopaedic Surgery

## 2023-07-23 DIAGNOSIS — Z01818 Encounter for other preprocedural examination: Secondary | ICD-10-CM

## 2023-07-23 DIAGNOSIS — M19012 Primary osteoarthritis, left shoulder: Secondary | ICD-10-CM | POA: Diagnosis not present

## 2023-07-28 ENCOUNTER — Encounter: Payer: Self-pay | Admitting: Nurse Practitioner

## 2023-07-28 ENCOUNTER — Ambulatory Visit (INDEPENDENT_AMBULATORY_CARE_PROVIDER_SITE_OTHER): Payer: BC Managed Care – PPO | Admitting: Nurse Practitioner

## 2023-07-28 VITALS — BP 140/90 | HR 64 | Temp 97.7°F | Ht 71.0 in | Wt 227.2 lb

## 2023-07-28 DIAGNOSIS — E785 Hyperlipidemia, unspecified: Secondary | ICD-10-CM

## 2023-07-28 DIAGNOSIS — Z01818 Encounter for other preprocedural examination: Secondary | ICD-10-CM

## 2023-07-28 DIAGNOSIS — Z125 Encounter for screening for malignant neoplasm of prostate: Secondary | ICD-10-CM | POA: Diagnosis not present

## 2023-07-28 DIAGNOSIS — R7303 Prediabetes: Secondary | ICD-10-CM | POA: Diagnosis not present

## 2023-07-28 DIAGNOSIS — I10 Essential (primary) hypertension: Secondary | ICD-10-CM | POA: Diagnosis not present

## 2023-07-28 NOTE — Assessment & Plan Note (Signed)
History of same pending A1c today

## 2023-07-28 NOTE — Assessment & Plan Note (Signed)
Patient currently maintained on rosuvastatin 20 mg.  Pending lipid panel today continue medication

## 2023-07-28 NOTE — Assessment & Plan Note (Signed)
Patient scheduled to undergo left total reverse shoulder replacement.  EKG within normal limits minus being bradycardic.  Will check basic labs if all labs are acceptable we will clear patient for procedure.

## 2023-07-28 NOTE — Assessment & Plan Note (Signed)
Not currently on medication.  Patient is borderline continue lifestyle modification EKG in office today

## 2023-07-28 NOTE — Patient Instructions (Signed)
Nice to see you today I will be in touch with the labs once I have them Schedule your physical with me in the next 3 months

## 2023-07-28 NOTE — Progress Notes (Signed)
Established Patient Office Visit  Subjective   Patient ID: Billy Haynes, male    DOB: 1961-02-26  Age: 62 y.o. MRN: [id number]  Chief Complaint  Patient presents with   Surgical clearance    HPI   Surgical clearance: patient is suppose tounder go a left total reverse shoulder replacement under the direction of Ramond Marrow, MD States that it is not scheduled   States that he has had no trouble with anesthiesia in the past.  Patient denies any personal or family history of malignant hyperthermia.  States as far as he is aware he is not difficult intubation either.  Patient denies having history of elevated blood pressure not on blood pressure medication currently patient also has HLD on rosuvastatin.  Patient has lost approximately 10 pounds since last office visit.  Patient is not followed by cardiology   Review of Systems  Constitutional:  Negative for chills and fever.  Respiratory:  Negative for shortness of breath.   Cardiovascular:  Negative for chest pain.  Gastrointestinal:        BM daily   Musculoskeletal:  Positive for joint pain.  Neurological:  Negative for headaches.      Objective:     BP (!) 140/90   Pulse 64   Temp 97.7 F (36.5 C) (Temporal)   Ht 5\' 11"  (1.803 m)   Wt 227 lb 3.2 oz (103.1 kg)   SpO2 93%   BMI 31.69 kg/m  BP Readings from Last 3 Encounters:  07/28/23 (!) 140/90  03/02/22 (!) 148/80  12/08/21 (!) 151/95   Wt Readings from Last 3 Encounters:  07/28/23 227 lb 3.2 oz (103.1 kg)  03/02/22 236 lb 12.8 oz (107.4 kg)  12/08/21 229 lb 15 oz (104.3 kg)   SpO2 Readings from Last 3 Encounters:  07/28/23 93%  03/02/22 98%  12/08/21 95%      Physical Exam Vitals and nursing note reviewed.  Constitutional:      Appearance: Normal appearance.  Cardiovascular:     Rate and Rhythm: Normal rate and regular rhythm.     Pulses: Normal pulses.     Heart sounds: Normal heart sounds.  Pulmonary:     Effort: Pulmonary effort is normal.      Breath sounds: Normal breath sounds.  Abdominal:     General: Bowel sounds are normal.  Musculoskeletal:     Right lower leg: No edema.     Left lower leg: No edema.  Skin:    General: Skin is warm.  Neurological:     General: No focal deficit present.     Mental Status: He is alert.     Deep Tendon Reflexes:     Reflex Scores:      Bicep reflexes are 2+ on the right side and 2+ on the left side.    Comments: Bilateral upper and lower extremity strength 5/5      No results found for any visits on 07/28/23.    The 10-year ASCVD risk score (Arnett DK, et al., 2019) is: 14.7%    Assessment & Plan:   Problem List Items Addressed This Visit       Cardiovascular and Mediastinum   Essential hypertension    Not currently on medication.  Patient is borderline continue lifestyle modification EKG in office today      Relevant Orders   TSH     Other   Screening for prostate cancer   Relevant Orders   PSA   Hyperlipidemia  Patient currently maintained on rosuvastatin 20 mg.  Pending lipid panel today continue medication      Relevant Orders   Lipid panel   Pre-operative clearance - Primary    Patient scheduled to undergo left total reverse shoulder replacement.  EKG within normal limits minus being bradycardic.  Will check basic labs if all labs are acceptable we will clear patient for procedure.      Relevant Orders   EKG 12-Lead (Completed)   CBC   Comprehensive metabolic panel   Hemoglobin A1c   Prediabetes    History of same pending A1c today      Relevant Orders   Hemoglobin A1c    Return in about 3 months (around 10/27/2023) for CPE and Labs.    Audria Nine, NP

## 2023-07-29 LAB — COMPREHENSIVE METABOLIC PANEL
ALT: 22 IU/L (ref 0–44)
AST: 25 IU/L (ref 0–40)
Albumin: 4.5 g/dL (ref 3.8–4.9)
Alkaline Phosphatase: 97 IU/L (ref 44–121)
BUN/Creatinine Ratio: 17 (ref 10–24)
BUN: 18 mg/dL (ref 8–27)
Bilirubin Total: 0.8 mg/dL (ref 0.0–1.2)
CO2: 21 mmol/L (ref 20–29)
Calcium: 9.7 mg/dL (ref 8.6–10.2)
Chloride: 103 mmol/L (ref 96–106)
Creatinine, Ser: 1.05 mg/dL (ref 0.76–1.27)
Globulin, Total: 2.6 g/dL (ref 1.5–4.5)
Glucose: 97 mg/dL (ref 70–99)
Potassium: 4.6 mmol/L (ref 3.5–5.2)
Sodium: 139 mmol/L (ref 134–144)
Total Protein: 7.1 g/dL (ref 6.0–8.5)
eGFR: 81 mL/min/{1.73_m2} (ref >59)

## 2023-07-29 LAB — PSA: Prostate Specific Ag, Serum: 0.7 ng/mL (ref 0.0–4.0)

## 2023-07-29 LAB — CBC
Hematocrit: 49.9 % (ref 37.5–51.0)
Hemoglobin: 16.1 g/dL (ref 13.0–17.7)
MCH: 29.5 pg (ref 26.6–33.0)
MCHC: 32.3 g/dL (ref 31.5–35.7)
MCV: 92 fL (ref 79–97)
Platelets: 209 10*3/uL (ref 150–450)
RBC: 5.45 x10E6/uL (ref 4.14–5.80)
RDW: 13.2 % (ref 11.6–15.4)
WBC: 5.1 10*3/uL (ref 3.4–10.8)

## 2023-07-29 LAB — LIPID PANEL
Chol/HDL Ratio: 6.9 ratio — ABNORMAL HIGH (ref 0.0–5.0)
Cholesterol, Total: 304 mg/dL — ABNORMAL HIGH (ref 100–199)
HDL: 44 mg/dL (ref >39)
LDL Chol Calc (NIH): 244 mg/dL — ABNORMAL HIGH (ref 0–99)
Triglycerides: 95 mg/dL (ref 0–149)
VLDL Cholesterol Cal: 16 mg/dL (ref 5–40)

## 2023-07-29 LAB — TSH: TSH: 1.84 u[IU]/mL (ref 0.450–4.500)

## 2023-07-29 LAB — HEMOGLOBIN A1C
Est. average glucose Bld gHb Est-mCnc: 123 mg/dL
Hgb A1c MFr Bld: 5.9 % — ABNORMAL HIGH (ref 4.8–5.6)

## 2023-10-07 ENCOUNTER — Encounter: Payer: BC Managed Care – PPO | Admitting: Nurse Practitioner

## 2023-10-20 ENCOUNTER — Ambulatory Visit (INDEPENDENT_AMBULATORY_CARE_PROVIDER_SITE_OTHER): Payer: BC Managed Care – PPO | Admitting: Nurse Practitioner

## 2023-10-20 ENCOUNTER — Encounter: Payer: Self-pay | Admitting: Nurse Practitioner

## 2023-10-20 VITALS — BP 122/88 | HR 60 | Temp 97.9°F | Ht 70.25 in | Wt 224.2 lb

## 2023-10-20 DIAGNOSIS — E7800 Pure hypercholesterolemia, unspecified: Secondary | ICD-10-CM

## 2023-10-20 DIAGNOSIS — R7303 Prediabetes: Secondary | ICD-10-CM

## 2023-10-20 DIAGNOSIS — Z0000 Encounter for general adult medical examination without abnormal findings: Secondary | ICD-10-CM

## 2023-10-20 DIAGNOSIS — I10 Essential (primary) hypertension: Secondary | ICD-10-CM

## 2023-10-20 NOTE — Patient Instructions (Signed)
Nice to see you today  I want to check your cholesterol in 3 months with a fasting lab visit Follow up with me in 1 year for your next physical, sooner if you need me

## 2023-10-20 NOTE — Progress Notes (Signed)
Established Patient Office Visit  Subjective   Patient ID: Billy Haynes, male    DOB: 1960-12-16  Age: 62 y.o. MRN: [id number]  Chief Complaint  Patient presents with   Annual Exam    HPI  HTN: hx dx. Patietn has been maintained on lifestyle modifications  HLD: Patient has been prescribed Crestor 10 mg.  States he has not taken that medication currently.  States as of late him and his wife has started a new eating pattern working on low-fat diet.   for complete physical and follow up of chronic conditions.  Immunizations: -Tetanus: Completed in 2019 -Influenza:  refused  -Shingles: refused  -Pneumonia: Too young  Diet: Fair diet. He is 1-2 meals a day. No snacks. States when he works he will eat at 9 and then sometimes when he gets home depending on if his spouse would like to eat or not. States that he drinks water Exercise: No regular exercise. Working in the yard   Eye exam: PRN Dental exam: Completes semi-annually    Colonoscopy: Completed in 01/12/2019, repeat in 5 years. Due 2025 Lung Cancer Screening: N/A  PSA: UTD.  Done September 2024  Sleep: he will go to bed around 9-10 and will get up around 4-530. Feels rested most times. He will sleep hard and will wake up sometimes at 2am. Intermittently snore      Review of Systems  Constitutional:  Negative for chills and fever.  Respiratory:  Negative for shortness of breath.   Cardiovascular:  Negative for chest pain and leg swelling.  Gastrointestinal:  Negative for abdominal pain, blood in stool, constipation, diarrhea, nausea and vomiting.       BM daily   Genitourinary:  Negative for dysuria and hematuria.  Neurological:  Negative for tingling and headaches.  Psychiatric/Behavioral:  Negative for hallucinations and suicidal ideas.       Objective:     BP 122/88   Pulse 60   Temp 97.9 F (36.6 C) (Oral)   Ht 5' 10.25" (1.784 m)   Wt 224 lb 3.2 oz (101.7 kg)   SpO2 98%   BMI 31.94 kg/m  BP  Readings from Last 3 Encounters:  10/20/23 122/88  07/28/23 (!) 140/90  03/02/22 (!) 148/80   Wt Readings from Last 3 Encounters:  10/20/23 224 lb 3.2 oz (101.7 kg)  07/28/23 227 lb 3.2 oz (103.1 kg)  03/02/22 236 lb 12.8 oz (107.4 kg)   SpO2 Readings from Last 3 Encounters:  10/20/23 98%  07/28/23 93%  03/02/22 98%      Physical Exam Vitals and nursing note reviewed.  Constitutional:      Appearance: Normal appearance.  HENT:     Right Ear: Tympanic membrane, ear canal and external ear normal.     Left Ear: Tympanic membrane, ear canal and external ear normal.     Mouth/Throat:     Mouth: Mucous membranes are moist.     Pharynx: Oropharynx is clear.  Eyes:     Extraocular Movements: Extraocular movements intact.     Pupils: Pupils are equal, round, and reactive to light.  Cardiovascular:     Rate and Rhythm: Normal rate and regular rhythm.     Pulses: Normal pulses.     Heart sounds: Normal heart sounds.  Pulmonary:     Effort: Pulmonary effort is normal.     Breath sounds: Normal breath sounds.  Abdominal:     General: There is no distension.     Palpations: There  is no mass.     Tenderness: There is no abdominal tenderness.     Hernia: No hernia is present.     Comments: Hyperactive bowel sounds   Musculoskeletal:     Right lower leg: No edema.     Left lower leg: No edema.  Lymphadenopathy:     Cervical: No cervical adenopathy.  Skin:    General: Skin is warm.  Neurological:     General: No focal deficit present.     Mental Status: He is alert.     Deep Tendon Reflexes:     Reflex Scores:      Bicep reflexes are 2+ on the right side and 2+ on the left side.      Patellar reflexes are 2+ on the right side and 2+ on the left side.    Comments: Bilateral upper and lower extremity strength 5/5  Psychiatric:        Mood and Affect: Mood normal.        Behavior: Behavior normal.        Thought Content: Thought content normal.        Judgment: Judgment  normal.      No results found for any visits on 10/20/23.    The 10-year ASCVD risk score (Arnett DK, et al., 2019) is: 12.7%    Assessment & Plan:   Problem List Items Addressed This Visit       Cardiovascular and Mediastinum   Essential hypertension    Patient currently maintained on lifestyle applications only.  Blood pressure within normal limits today.  Continue working on lifestyle modifications        Other   Preventative health care - Primary    Did discuss age-appropriate immunizations and screening exams.  Did review patient's personal, surgical, social, family histories.  Patient is up-to-date on all age-appropriate vaccinations he would like.  Patient refused the flu and shingles vaccine today.  Patient is up-to-date on CRC screening or prostate cancer screening.  Patient was given information at discharge about preventative healthcare maintenance with anticipatory guidance.      Hyperlipidemia    History of the same.  Patient not currently taking Crestor 10 mg.  We did discuss the possibility of doing lipoprotein a and a cardiac CT calcium score.  Patient states he is working on his dietary changes currently and has had good result with that in the past.  Will do a fasting lab visit in 3 months if still elevated will consider medication or the aforementioned interventions      Relevant Orders   Lipid panel   Prediabetes    History of the same.  Continue Ortho lifestyle modifications       Return in about 1 year (around 10/19/2024) for CPE and Labs.    Audria Nine, NP

## 2023-10-20 NOTE — Assessment & Plan Note (Signed)
Patient currently maintained on lifestyle applications only.  Blood pressure within normal limits today.  Continue working on lifestyle modifications

## 2023-10-20 NOTE — Assessment & Plan Note (Signed)
History of the same.  Continue Ortho lifestyle modifications

## 2023-10-20 NOTE — Assessment & Plan Note (Signed)
Did discuss age-appropriate immunizations and screening exams.  Did review patient's personal, surgical, social, family histories.  Patient is up-to-date on all age-appropriate vaccinations he would like.  Patient refused the flu and shingles vaccine today.  Patient is up-to-date on CRC screening or prostate cancer screening.  Patient was given information at discharge about preventative healthcare maintenance with anticipatory guidance.

## 2023-10-20 NOTE — Assessment & Plan Note (Signed)
History of the same.  Patient not currently taking Crestor 10 mg.  We did discuss the possibility of doing lipoprotein a and a cardiac CT calcium score.  Patient states he is working on his dietary changes currently and has had good result with that in the past.  Will do a fasting lab visit in 3 months if still elevated will consider medication or the aforementioned interventions

## 2023-10-27 DIAGNOSIS — M65912 Unspecified synovitis and tenosynovitis, left shoulder: Secondary | ICD-10-CM | POA: Diagnosis not present

## 2023-10-27 DIAGNOSIS — S46112A Strain of muscle, fascia and tendon of long head of biceps, left arm, initial encounter: Secondary | ICD-10-CM | POA: Diagnosis not present

## 2023-10-27 DIAGNOSIS — M25712 Osteophyte, left shoulder: Secondary | ICD-10-CM | POA: Diagnosis not present

## 2023-10-27 DIAGNOSIS — M75102 Unspecified rotator cuff tear or rupture of left shoulder, not specified as traumatic: Secondary | ICD-10-CM | POA: Diagnosis not present

## 2024-01-04 ENCOUNTER — Encounter: Payer: Self-pay | Admitting: Gastroenterology

## 2024-01-20 ENCOUNTER — Other Ambulatory Visit: Payer: BC Managed Care – PPO

## 2024-01-26 DIAGNOSIS — M19012 Primary osteoarthritis, left shoulder: Secondary | ICD-10-CM | POA: Diagnosis not present

## 2024-05-29 ENCOUNTER — Ambulatory Visit: Admitting: Nurse Practitioner

## 2024-06-19 ENCOUNTER — Ambulatory Visit: Admitting: Nurse Practitioner

## 2024-10-23 ENCOUNTER — Ambulatory Visit (INDEPENDENT_AMBULATORY_CARE_PROVIDER_SITE_OTHER): Payer: BC Managed Care – PPO | Admitting: Nurse Practitioner

## 2024-10-23 ENCOUNTER — Encounter: Payer: Self-pay | Admitting: Nurse Practitioner

## 2024-10-23 VITALS — BP 128/86 | HR 61 | Temp 97.7°F | Ht 70.0 in | Wt 225.4 lb

## 2024-10-23 DIAGNOSIS — R5383 Other fatigue: Secondary | ICD-10-CM | POA: Diagnosis not present

## 2024-10-23 DIAGNOSIS — E785 Hyperlipidemia, unspecified: Secondary | ICD-10-CM

## 2024-10-23 DIAGNOSIS — Z0000 Encounter for general adult medical examination without abnormal findings: Secondary | ICD-10-CM | POA: Diagnosis not present

## 2024-10-23 DIAGNOSIS — R7303 Prediabetes: Secondary | ICD-10-CM

## 2024-10-23 DIAGNOSIS — Z125 Encounter for screening for malignant neoplasm of prostate: Secondary | ICD-10-CM | POA: Diagnosis not present

## 2024-10-23 DIAGNOSIS — I10 Essential (primary) hypertension: Secondary | ICD-10-CM | POA: Diagnosis not present

## 2024-10-23 DIAGNOSIS — K4090 Unilateral inguinal hernia, without obstruction or gangrene, not specified as recurrent: Secondary | ICD-10-CM

## 2024-10-23 DIAGNOSIS — R6882 Decreased libido: Secondary | ICD-10-CM | POA: Insufficient documentation

## 2024-10-23 NOTE — Assessment & Plan Note (Signed)
 Pending TSH, CBC, CMP, B12, vitamin D, testosterone.

## 2024-10-23 NOTE — Assessment & Plan Note (Signed)
 History of the same weight has been stable patient has made dietary modifications.  Pending A1c

## 2024-10-23 NOTE — Patient Instructions (Signed)
 Nice to see you today I will be in touch with the labs once I have them Follow up with me in 1 year, sooner if you need me

## 2024-10-23 NOTE — Assessment & Plan Note (Signed)
 Pending testosterone today

## 2024-10-23 NOTE — Assessment & Plan Note (Signed)
 No red flag symptoms in office.  We did discuss signs and symptoms when to be seen emergently.  He will reach out to me when he is ready to see surgeon

## 2024-10-23 NOTE — Assessment & Plan Note (Signed)
 History of the same was prescribed rosuvastatin .  Patient decided not to take after consulting with friends about medication.  He has made lifestyle modifications pending lipid panel today

## 2024-10-23 NOTE — Assessment & Plan Note (Signed)
 Historical diagnosis blood pressure within normal limits.  Continue with no antihypertensive medications currently

## 2024-10-23 NOTE — Progress Notes (Signed)
 Established Patient Office Visit  Subjective   Patient ID: Billy Haynes, male    DOB: [birth year]  Age: 63 y.o. MRN: [id number]  Chief Complaint  Patient presents with   Annual Exam    HPI  HTN: Patient currently maintained on lifestyle modifications only  HLD: Patient currently maintained on rosuvastatin  10 mg daily?. He is not taking it currently. Not currently on medications due to consulting with friend s  for complete physical and follow up of chronic conditions.  Immunizations: -Tetanus: Completed in 2019 -Influenza: Refused -Shingles: Refused -Pneumonia: Refused  Diet: Fair diet. Cut out fast food and sodas. States that he is eating once a day.  He will snack with a yogurt. He is drinking sweet tea and water  Exercise: No regular exercise.  Been working shift 6 days a week  Eye exam: PRN  Dental exam: Completes semi-annually    Colonoscopy: Completed in 01/12/2019, repeat 7 years.  States that he was sent  Lung Cancer Screening: N/A  PSA: Due  Sleep: going to bed around 9 and then he will wake up around 3 for the bathroom. He will go back to sleep sometimes.  Feels rested       Review of Systems  Constitutional:  Negative for chills and fever.  Respiratory:  Negative for shortness of breath.   Cardiovascular:  Negative for chest pain and leg swelling.  Gastrointestinal:  Negative for abdominal pain, blood in stool, constipation, diarrhea, nausea and vomiting.       Bm daily   Genitourinary:  Negative for dysuria and hematuria.  Neurological:  Negative for dizziness, tingling and headaches.  Psychiatric/Behavioral:  Negative for hallucinations and suicidal ideas.       Objective:     BP 128/86   Pulse 61   Temp 97.7 F (36.5 C) (Oral)   Ht 5' 10 (1.778 m)   Wt 225 lb 6.4 oz (102.2 kg)   SpO2 94%   BMI 32.34 kg/m  BP Readings from Last 3 Encounters:  10/23/24 128/86  10/20/23 122/88  07/28/23 (!) 140/90   Wt Readings from Last 3  Encounters:  10/23/24 225 lb 6.4 oz (102.2 kg)  10/20/23 224 lb 3.2 oz (101.7 kg)  07/28/23 227 lb 3.2 oz (103.1 kg)   SpO2 Readings from Last 3 Encounters:  10/23/24 94%  10/20/23 98%  07/28/23 93%      Physical Exam Vitals and nursing note reviewed. Exam conducted with a chaperone present Devere Hummer, CMA).  Constitutional:      Appearance: Normal appearance.  HENT:     Right Ear: Tympanic membrane, ear canal and external ear normal.     Left Ear: Tympanic membrane, ear canal and external ear normal.     Mouth/Throat:     Mouth: Mucous membranes are moist.     Pharynx: Oropharynx is clear.  Eyes:     Extraocular Movements: Extraocular movements intact.     Pupils: Pupils are equal, round, and reactive to light.  Cardiovascular:     Rate and Rhythm: Normal rate and regular rhythm.     Pulses: Normal pulses.     Heart sounds: Normal heart sounds.  Pulmonary:     Effort: Pulmonary effort is normal.     Breath sounds: Normal breath sounds.  Abdominal:     General: Bowel sounds are normal. There is no distension.     Palpations: There is no mass.     Tenderness: There is no abdominal tenderness.  Hernia: No hernia is present.  Genitourinary:   Musculoskeletal:     Right lower leg: No edema.     Left lower leg: No edema.  Lymphadenopathy:     Cervical: No cervical adenopathy.  Skin:    General: Skin is warm.  Neurological:     General: No focal deficit present.     Mental Status: He is alert.     Deep Tendon Reflexes:     Reflex Scores:      Bicep reflexes are 2+ on the right side and 2+ on the left side.      Patellar reflexes are 2+ on the right side and 2+ on the left side.    Comments: Bilateral upper and lower extremity strength 5/5  Psychiatric:        Mood and Affect: Mood normal.        Behavior: Behavior normal.        Thought Content: Thought content normal.        Judgment: Judgment normal.      No results found for any visits on  10/23/24.    The 10-year ASCVD risk score (Arnett DK, et al., 2019) is: 14.5%    Assessment & Plan:   Problem List Items Addressed This Visit       Cardiovascular and Mediastinum   Essential hypertension   Historical diagnosis blood pressure within normal limits.  Continue with no antihypertensive medications currently      Relevant Orders   CBC with Differential/Platelet   Comprehensive metabolic panel with GFR   TSH     Other   Screening for prostate cancer   Relevant Orders   PSA   Right inguinal hernia   No red flag symptoms in office.  We did discuss signs and symptoms when to be seen emergently.  He will reach out to me when he is ready to see surgeon      Preventative health care - Primary   Discussed age-appropriate immunizations and screening exams.  Did review patient's personal, surgical, social, family histories.  Patient is up-to-date on all age-appropriate vaccinations he would like.  Patient declined all vaccines today in office.  Patient is up-to-date on CRC screening.  PSA today for prostate cancer screening.  Patient was given information at discharge about preventative healthcare maintenance with anticipatory guidance.      Relevant Orders   CBC with Differential/Platelet   Comprehensive metabolic panel with GFR   TSH   Hyperlipidemia   History of the same was prescribed rosuvastatin .  Patient decided not to take after consulting with friends about medication.  He has made lifestyle modifications pending lipid panel today      Relevant Orders   Hemoglobin A1c   Lipid panel   Prediabetes   History of the same weight has been stable patient has made dietary modifications.  Pending A1c      Relevant Orders   Hemoglobin A1c   Lipid panel   Decreased sex drive   Pending testosterone today      Relevant Orders   Testosterone,Free and Total   Fatigue   Pending TSH, CBC, CMP, B12, vitamin D, testosterone.      Relevant Orders   CBC with  Differential/Platelet   Comprehensive metabolic panel with GFR   Testosterone,Free and Total   TSH   Vitamin B12   VITAMIN D 25 Hydroxy (Vit-D Deficiency, Fractures)    Return in about 1 year (around 10/23/2025) for CPE and Labs.  Adina Crandall, NP

## 2024-10-23 NOTE — Assessment & Plan Note (Signed)
 Discussed age-appropriate immunizations and screening exams.  Did review patient's personal, surgical, social, family histories.  Patient is up-to-date on all age-appropriate vaccinations he would like.  Patient declined all vaccines today in office.  Patient is up-to-date on CRC screening.  PSA today for prostate cancer screening.  Patient was given information at discharge about preventative healthcare maintenance with anticipatory guidance.

## 2024-10-24 LAB — COMPREHENSIVE METABOLIC PANEL WITH GFR
ALT: 19 IU/L (ref 0–44)
AST: 23 IU/L (ref 0–40)
Albumin: 4.5 g/dL (ref 3.9–4.9)
Alkaline Phosphatase: 81 IU/L (ref 47–123)
BUN/Creatinine Ratio: 18 (ref 10–24)
BUN: 17 mg/dL (ref 8–27)
Bilirubin Total: 0.6 mg/dL (ref 0.0–1.2)
CO2: 21 mmol/L (ref 20–29)
Calcium: 9.4 mg/dL (ref 8.6–10.2)
Chloride: 102 mmol/L (ref 96–106)
Creatinine, Ser: 0.96 mg/dL (ref 0.76–1.27)
Globulin, Total: 2.5 g/dL (ref 1.5–4.5)
Glucose: 99 mg/dL (ref 70–99)
Potassium: 4.1 mmol/L (ref 3.5–5.2)
Sodium: 138 mmol/L (ref 134–144)
Total Protein: 7 g/dL (ref 6.0–8.5)
eGFR: 90 mL/min/1.73 (ref >59)

## 2024-10-24 LAB — VITAMIN B12: Vitamin B-12: 655 pg/mL (ref 232–1245)

## 2024-10-24 LAB — CBC WITH DIFFERENTIAL/PLATELET
Basophils Absolute: 0 x10E3/uL (ref 0.0–0.2)
Basos: 1 %
EOS (ABSOLUTE): 0.2 x10E3/uL (ref 0.0–0.4)
Eos: 3 %
Hematocrit: 48.3 % (ref 37.5–51.0)
Hemoglobin: 16.3 g/dL (ref 13.0–17.7)
Immature Grans (Abs): 0 x10E3/uL (ref 0.0–0.1)
Immature Granulocytes: 0 %
Lymphocytes Absolute: 1.4 x10E3/uL (ref 0.7–3.1)
Lymphs: 22 %
MCH: 30 pg (ref 26.6–33.0)
MCHC: 33.7 g/dL (ref 31.5–35.7)
MCV: 89 fL (ref 79–97)
Monocytes Absolute: 0.4 x10E3/uL (ref 0.1–0.9)
Monocytes: 7 %
Neutrophils Absolute: 4.3 x10E3/uL (ref 1.4–7.0)
Neutrophils: 67 %
Platelets: 233 x10E3/uL (ref 150–450)
RBC: 5.43 x10E6/uL (ref 4.14–5.80)
RDW: 12.9 % (ref 11.6–15.4)
WBC: 6.4 x10E3/uL (ref 3.4–10.8)

## 2024-10-24 LAB — LIPID PANEL
Chol/HDL Ratio: 6.4 ratio — ABNORMAL HIGH (ref 0.0–5.0)
Cholesterol, Total: 276 mg/dL — ABNORMAL HIGH (ref 100–199)
HDL: 43 mg/dL (ref >39)
LDL Chol Calc (NIH): 206 mg/dL — ABNORMAL HIGH (ref 0–99)
Triglycerides: 146 mg/dL (ref 0–149)
VLDL Cholesterol Cal: 27 mg/dL (ref 5–40)

## 2024-10-24 LAB — HEMOGLOBIN A1C
Est. average glucose Bld gHb Est-mCnc: 117 mg/dL
Hgb A1c MFr Bld: 5.7 % — ABNORMAL HIGH (ref 4.8–5.6)

## 2024-10-24 LAB — TESTOSTERONE,FREE AND TOTAL
Testosterone, Free: 12.7 pg/mL (ref 6.6–18.1)
Testosterone: 457 ng/dL (ref 264–916)

## 2024-10-24 LAB — TSH: TSH: 3.12 u[IU]/mL (ref 0.450–4.500)

## 2024-10-24 LAB — PSA: Prostate Specific Ag, Serum: 0.5 ng/mL (ref 0.0–4.0)

## 2024-10-24 LAB — VITAMIN D 25 HYDROXY (VIT D DEFICIENCY, FRACTURES): Vit D, 25-Hydroxy: 23.5 ng/mL — ABNORMAL LOW (ref 30.0–100.0)

## 2024-10-25 ENCOUNTER — Ambulatory Visit: Payer: Self-pay | Admitting: Nurse Practitioner
